# Patient Record
Sex: Female | Born: 1975 | Race: White | Hispanic: No | Marital: Married
Health system: Midwestern US, Community
[De-identification: ages and names within clinical notes are randomized; demographics above are authoritative.]

## PROBLEM LIST (undated history)

## (undated) DIAGNOSIS — F329 Major depressive disorder, single episode, unspecified: Secondary | ICD-10-CM

## (undated) DIAGNOSIS — R51 Headache: Secondary | ICD-10-CM

## (undated) DIAGNOSIS — F419 Anxiety disorder, unspecified: Secondary | ICD-10-CM

## (undated) DIAGNOSIS — R519 Headache, unspecified: Secondary | ICD-10-CM

## (undated) DIAGNOSIS — R87629 Unspecified abnormal cytological findings in specimens from vagina: Secondary | ICD-10-CM

## (undated) DIAGNOSIS — F32A Depression, unspecified: Secondary | ICD-10-CM

## (undated) DIAGNOSIS — K219 Gastro-esophageal reflux disease without esophagitis: Secondary | ICD-10-CM

## (undated) DIAGNOSIS — G4452 New daily persistent headache (NDPH): Principal | ICD-10-CM

## (undated) DIAGNOSIS — Z1231 Encounter for screening mammogram for malignant neoplasm of breast: Principal | ICD-10-CM

## (undated) DIAGNOSIS — R1084 Generalized abdominal pain: Secondary | ICD-10-CM

## (undated) DIAGNOSIS — Z1239 Encounter for other screening for malignant neoplasm of breast: Secondary | ICD-10-CM

## (undated) HISTORY — PX: WISDOM TOOTH EXTRACTION: SHX21

## (undated) HISTORY — PX: HERNIA REPAIR: SHX51

## (undated) HISTORY — DX: Depression, unspecified: F32.A

## (undated) HISTORY — DX: Major depressive disorder, single episode, unspecified: F32.9

## (undated) HISTORY — DX: Anxiety disorder, unspecified: F41.9

---

## 1998-06-06 ENCOUNTER — Emergency Department (HOSPITAL_COMMUNITY): Admission: EM | Admit: 1998-06-06 | Discharge: 1998-06-06 | Payer: Self-pay | Admitting: Emergency Medicine

## 2014-09-27 NOTE — L&D Delivery Note (Signed)
Pt presented with PROM. She was admitted to L&D and rapidly completed the first stage. She pushed for 5 min and had a SVD of one live viable infant over an intact perineum in the ROA position. Placenta-S/I. EBL-400cc. Baby to NICU.

## 2014-12-17 ENCOUNTER — Ambulatory Visit (INDEPENDENT_AMBULATORY_CARE_PROVIDER_SITE_OTHER): Payer: Worker's Compensation | Admitting: Physician Assistant

## 2014-12-17 VITALS — BP 124/74 | HR 71 | Temp 98.0°F | Resp 17 | Ht 65.0 in | Wt 146.0 lb

## 2014-12-17 DIAGNOSIS — Z209 Contact with and (suspected) exposure to unspecified communicable disease: Secondary | ICD-10-CM | POA: Diagnosis not present

## 2014-12-17 DIAGNOSIS — Y990 Civilian activity done for income or pay: Secondary | ICD-10-CM

## 2014-12-17 DIAGNOSIS — Z23 Encounter for immunization: Secondary | ICD-10-CM | POA: Diagnosis not present

## 2014-12-17 LAB — POCT CBC
GRANULOCYTE PERCENT: 51 % (ref 37–80)
HCT, POC: 38.1 % (ref 37.7–47.9)
Hemoglobin: 12.4 g/dL (ref 12.2–16.2)
Lymph, poc: 3.6 — AB (ref 0.6–3.4)
MCH, POC: 29.2 pg (ref 27–31.2)
MCHC: 32.6 g/dL (ref 31.8–35.4)
MCV: 89.6 fL (ref 80–97)
MID (cbc): 0.5 (ref 0–0.9)
MPV: 7.1 fL (ref 0–99.8)
PLATELET COUNT, POC: 285 10*3/uL (ref 142–424)
POC GRANULOCYTE: 4.3 (ref 2–6.9)
POC LYMPH %: 42.8 % (ref 10–50)
POC MID %: 6.2 %M (ref 0–12)
RBC: 4.26 M/uL (ref 4.04–5.48)
RDW, POC: 12.4 %
WBC: 8.4 10*3/uL (ref 4.6–10.2)

## 2014-12-17 LAB — POCT URINE PREGNANCY: PREG TEST UR: NEGATIVE

## 2014-12-17 MED ORDER — RALTEGRAVIR POTASSIUM 400 MG PO TABS: 400.0000 mg | ORAL_TABLET | Freq: Two times a day (BID) | ORAL | Status: AC

## 2014-12-17 MED ORDER — ONDANSETRON 8 MG PO TBDP: 8.0000 mg | ORAL_TABLET | Freq: Three times a day (TID) | ORAL | Status: DC | PRN

## 2014-12-17 MED ORDER — EMTRICITABINE-TENOFOVIR DF 200-300 MG PO TABS: 1.0000 | ORAL_TABLET | Freq: Every day | ORAL | Status: AC

## 2014-12-17 NOTE — Progress Notes (Addendum)
12/18/2014 at 10:31 AM  Wanda Moreno / DOB: 07/29/1976 / MRN: [id number]  Source Patient MRN: [id number]  SUBJECTIVE  Chief complaint: exposure to dirty dental tools   History of present illness: Wanda Moreno is 39 y.o. well appearing female presenting for a blood exposure during a dental procedure.  She was cleaning the infection sources teeth with a non hollow bore scaler, placed the instrument down and later went to reach with her right hand bumped into the scaling aspect of the instrument with her right medial hand and reports the instrument punctured her glove.  She took the glove off and saw that she was bleeding. She denies constitutional symptoms at this time.    She denies a history of infectious disease.  She was informed that the source patient has a known history of HIV, and that his viral load is undetectable as of January.  After some consideration she would she would like HIV prophylaxis.  The problem list, medications, medical, surgical, family and social reviewed and updated by me and exist elsewhere in the encounter.   Review of Systems  Psychiatric/Behavioral: Negative for depression. The patient is nervous/anxious.     OBJECTIVE  Her  height is 5\' 5"  (1.651 m) and weight is 146 lb (66.225 kg). Her oral temperature is 98 F (36.7 C). Her blood pressure is 124/74 and her pulse is 71. Her respiration is 17 and oxygen saturation is 98%.  The patient's body mass index is 24.3 kg/(m^2).  Physical Exam  Constitutional: She appears well-developed and well-nourished. No distress.  HENT:  Head: Normocephalic.  Mouth/Throat: No oropharyngeal exudate.  Eyes: Pupils are equal, round, and reactive to light. No scleral icterus.  Cardiovascular: Normal rate.   Respiratory: Effort normal.  Skin: She is not diaphoretic.    Results for orders placed or performed in visit on 12/17/14 (from the past 24 hour(s))  POCT CBC     Status: Abnormal   Collection Time: 12/17/14   7:45 PM  Result Value Ref Range   WBC 8.4 4.6 - 10.2 K/uL   Lymph, poc 3.6 (A) 0.6 - 3.4   POC LYMPH PERCENT 42.8 10 - 50 %L   MID (cbc) 0.5 0 - 0.9   POC MID % 6.2 0 - 12 %M   POC Granulocyte 4.3 2 - 6.9   Granulocyte percent 51.0 37 - 80 %G   RBC 4.26 4.04 - 5.48 M/uL   Hemoglobin 12.4 12.2 - 16.2 g/dL   HCT, POC 38.1 37.7 - 47.9 %   MCV 89.6 80 - 97 fL   MCH, POC 29.2 27 - 31.2 pg   MCHC 32.6 31.8 - 35.4 g/dL   RDW, POC 12.4 %   Platelet Count, POC 285 142 - 424 K/uL   MPV 7.1 0 - 99.8 fL  POCT urine pregnancy     Status: None   Collection Time: 12/17/14  8:20 PM  Result Value Ref Range   Preg Test, Ur Negative    Infectious disease phone consult: I personally spoke with Dr. Linus Salmons, the on call ID attending at [facility].  After explaining the details of the exposure, he felt there was a very low risk of transmission given the lack of blood and minor puncture with a non bored scaling instrument, as well as the undetectable viral load of the source patient. Author advised that patient wanted to pursue HIV prophylaxis regardless of source patient's status.  Medical history reviewed by Dr. Linus Salmons with Author, and  felt the patient would tolerate HIV prophylaxis well, and thus the benefits of therapy outweigh the risks of adverse effects.  Specialist advised triple therapy via Truvada 400 qd, and Isentress 400 mg bid for thirty days.  ASSESSMENT & PLAN  Wanda Moreno was seen today for exposure to dirty dental tools.  Diagnoses and all orders for this visit:  Exposure to communicable disease: HIV prophylaxis provided per patient's request.  Confirmed that pharmacy has medication available tonight.  Advised patient use condoms until repeat testing confirms she is HIV negative. Advised that she stay off of the WWW regarding her health. She will need to return in two weeks for a repeat BMET, CBC with diff and amylase. Advised that she take medications with food and use Zofran as prescribed.        Orders: -     Hepatitis B surface antigen -     HIV antibody -     Hepatitis C Ab Reflex HCV RNA, QUANT -     POCT CBC -     POCT urine pregnancy -     Comprehensive metabolic panel -     Amylase -     Hepatitis B surface antibody -     Tdap vaccine greater than or equal to 7yo IM -     emtricitabine-tenofovir (TRUVADA) 200-300 MG per tablet; Take 1 tablet by mouth daily. Take with food. -     raltegravir (ISENTRESS) 400 MG tablet; Take 1 tablet (400 mg total) by mouth 2 (two) times daily. -     ondansetron (ZOFRAN-ODT) 8 MG disintegrating tablet; Take 1 tablet (8 mg total) by mouth every 8 (eight) hours as needed for nausea.   I personally spent more than 60 minutes with this patient, with the majority of that time spent in counseling and discussion of the assessment and plan.    The patient was advised to call or come back to clinic if she does not see an improvement in symptoms, or worsens with the above plan.   [name], MHS, PA-C [facility] 12/18/2014 10:31 AM

## 2014-12-18 ENCOUNTER — Telehealth: Payer: Self-pay

## 2014-12-18 ENCOUNTER — Encounter: Payer: Self-pay | Admitting: Physician Assistant

## 2014-12-18 DIAGNOSIS — F411 Generalized anxiety disorder: Secondary | ICD-10-CM

## 2014-12-18 DIAGNOSIS — F5105 Insomnia due to other mental disorder: Principal | ICD-10-CM

## 2014-12-18 DIAGNOSIS — F409 Phobic anxiety disorder, unspecified: Secondary | ICD-10-CM

## 2014-12-18 MED ORDER — TEMAZEPAM 7.5 MG PO CAPS: 7.5000 mg | ORAL_CAPSULE | Freq: Every evening | ORAL | Status: DC | PRN

## 2014-12-18 NOTE — Telephone Encounter (Signed)
Michael please advise.

## 2014-12-18 NOTE — Telephone Encounter (Signed)
Called in Rx for pt, Temazepam. Pt notified.

## 2014-12-18 NOTE — Telephone Encounter (Signed)
Legrand Como told pt to come get rx, but he is not here to sign. LMOM to CB to find out what pharm she wants this called into (unsure if she wants to use same pharm as last night) Tried calling again-did not LM the second time.

## 2014-12-18 NOTE — Telephone Encounter (Signed)
Patient was seen yesterday for an exposure. Due to the outcome of this situation patient states she was not able to sleep last night. Patient is requesting something be called in for her to rest. She is also concerned because there is an "A" beside her lymph results on her lab.

## 2014-12-18 NOTE — Telephone Encounter (Signed)
Spoke with patient. She report her husband Young Brim at [phone] i shaving a difficult time with the news of her exposure.  She also reports difficulty sleeping due to excessive worry, and request that something be prescribed that will allow her to rest.  [name], MS, PA-C   11:54 AM, 12/18/2014

## 2014-12-19 ENCOUNTER — Encounter: Payer: Self-pay | Admitting: Physician Assistant

## 2014-12-19 ENCOUNTER — Telehealth: Payer: Self-pay | Admitting: Physician Assistant

## 2014-12-19 NOTE — Telephone Encounter (Signed)
I called in Rx for pt. Pt notified.

## 2014-12-20 ENCOUNTER — Encounter: Payer: Self-pay | Admitting: Physician Assistant

## 2014-12-20 ENCOUNTER — Telehealth: Payer: Self-pay

## 2014-12-20 NOTE — Telephone Encounter (Signed)
Requesting a call from [name]

## 2014-12-20 NOTE — Telephone Encounter (Signed)
Open in error

## 2014-12-20 NOTE — Telephone Encounter (Signed)
Wanda Moreno please call.

## 2014-12-21 ENCOUNTER — Ambulatory Visit (INDEPENDENT_AMBULATORY_CARE_PROVIDER_SITE_OTHER): Payer: BLUE CROSS/BLUE SHIELD | Admitting: Urgent Care

## 2014-12-21 VITALS — BP 106/68 | HR 82 | Temp 98.0°F | Resp 16 | Ht 64.0 in | Wt 147.0 lb

## 2014-12-21 DIAGNOSIS — Z87440 Personal history of urinary (tract) infections: Secondary | ICD-10-CM

## 2014-12-21 DIAGNOSIS — N390 Urinary tract infection, site not specified: Secondary | ICD-10-CM | POA: Diagnosis not present

## 2014-12-21 DIAGNOSIS — R319 Hematuria, unspecified: Secondary | ICD-10-CM | POA: Diagnosis not present

## 2014-12-21 DIAGNOSIS — Z87442 Personal history of urinary calculi: Secondary | ICD-10-CM

## 2014-12-21 DIAGNOSIS — R300 Dysuria: Secondary | ICD-10-CM | POA: Diagnosis not present

## 2014-12-21 DIAGNOSIS — Z87448 Personal history of other diseases of urinary system: Secondary | ICD-10-CM

## 2014-12-21 LAB — POCT URINALYSIS DIPSTICK
Bilirubin, UA: NEGATIVE
Glucose, UA: 100
KETONES UA: NEGATIVE
NITRITE UA: POSITIVE
PH UA: 5
Protein, UA: NEGATIVE
Spec Grav, UA: <=1.005
Urobilinogen, UA: 0.2

## 2014-12-21 LAB — POCT UA - MICROSCOPIC ONLY
Bacteria, U Microscopic: NEGATIVE
Casts, Ur, LPF, POC: NEGATIVE
Crystals, Ur, HPF, POC: NEGATIVE
Epithelial cells, urine per micros: NEGATIVE
MUCUS UA: NEGATIVE
RBC, URINE, MICROSCOPIC: NEGATIVE
Yeast, UA: NEGATIVE

## 2014-12-21 MED ORDER — CIPROFLOXACIN HCL 500 MG PO TABS: 500.0000 mg | ORAL_TABLET | Freq: Two times a day (BID) | ORAL | Status: DC

## 2014-12-21 NOTE — Progress Notes (Signed)
    MRN: [id number] DOB: [birth year]  Subjective:   Wanda Moreno is a 39 y.o. female presenting for chief complaint of Urinary Tract Infection  Reports onset of dysuria this am, sensation of incomplete emptying, associated with malodorous urine, pelvic pressure. Has tried Azo otc with minimal relief. Has had UTI's frequently since her 20's, hx of renal stones, pyelonephritis. Denies fevers, hematuria, cloudy urine, urinary frequency, abdominal pain, vomiting, vaginal irritation, genital rashes, vaginal bleeding. Denies smoking, occasional alcohol drink. Denies any other aggravating or relieving factors, no other questions or concerns.  Wanda Moreno has a current medication list which includes the following prescription(s): emtricitabine-tenofovir, ondansetron, raltegravir, and temazepam. She has No Known Allergies.  Wanda Moreno  has no past medical history on file. Also  has no past surgical history on file.  ROS As in subjective.  Objective:   Vitals: BP 106/68 mmHg  Pulse 82  Temp(Src) 98 F (36.7 C)  Resp 16  Ht 5\' 4"  (1.626 m)  Wt 147 lb (66.679 kg)  BMI 25.22 kg/m2  SpO2 98%  LMP 12/11/2014  Physical Exam  Constitutional: She is oriented to person, place, and time and well-developed, well-nourished, and in no distress.  Cardiovascular: Normal rate, regular rhythm and intact distal pulses.  Exam reveals no gallop and no friction rub.   No murmur heard. Pulmonary/Chest: No respiratory distress. She has no wheezes. She has no rales. She exhibits no tenderness.  Abdominal: Soft. Bowel sounds are normal. She exhibits no distension and no mass. There is tenderness (suprapubic discomfort with deep palpation).  No CVA tenderness.  Neurological: She is alert and oriented to person, place, and time.  Skin: Skin is warm and dry. No rash noted. No erythema. No pallor.   Results for orders placed or performed in visit on 12/21/14 (from the past 24 hour(s))  POCT UA - Microscopic Only      Status: None   Collection Time: 12/21/14 12:18 PM  Result Value Ref Range   WBC, Ur, HPF, POC 10-15    RBC, urine, microscopic neg    Bacteria, U Microscopic neg    Mucus, UA neg    Epithelial cells, urine per micros neg    Crystals, Ur, HPF, POC neg    Casts, Ur, LPF, POC neg    Yeast, UA neg   POCT urinalysis dipstick     Status: None   Collection Time: 12/21/14 12:18 PM  Result Value Ref Range   Color, UA orange    Clarity, UA clear    Glucose, UA 100    Bilirubin, UA neg    Ketones, UA neg    Spec Grav, UA <=1.005    Blood, UA small    pH, UA 5.0    Protein, UA neg    Urobilinogen, UA 0.2    Nitrite, UA positive    Leukocytes, UA small (1+)    Assessment and Plan :   1. Dysuria 2. Urinary tract infection with hematuria, site unspecified 3. History of pyelonephritis 4. History of renal stone - Start ciprofloxacin x10 days, urine culture pending. Advised aggressive hydration, continue Azo. Referral to urology for evaluation of frequent UTI's, history of pyelonephritis and renal stones. - Return to clinic as needed.  Jaynee Eagles, PA-C [facility] [phone] 12/21/2014 12:18 PM

## 2014-12-22 LAB — URINE CULTURE
Colony Count: NO GROWTH
ORGANISM ID, BACTERIA: NO GROWTH

## 2014-12-23 ENCOUNTER — Encounter: Payer: Self-pay | Admitting: Physician Assistant

## 2014-12-23 NOTE — Telephone Encounter (Signed)
Michael please call.

## 2014-12-23 NOTE — Telephone Encounter (Signed)
Legrand Como please call - patient has more questions - just got off the phone with you within five minutes   [phone]

## 2014-12-23 NOTE — Telephone Encounter (Signed)
Patient has a swollen lymp node and is very concerned.   Work related exposure,  She is four hours away.  Please call asap.   Workers [employer].  Sounds panicky.  Suggested she come in.  Wants a doctor to advise her, if she can wait to come in this evening or go to someone where she is now.   Prioritized   [phone]

## 2014-12-24 ENCOUNTER — Ambulatory Visit (INDEPENDENT_AMBULATORY_CARE_PROVIDER_SITE_OTHER): Payer: Worker's Compensation | Admitting: Physician Assistant

## 2014-12-24 VITALS — BP 112/70 | HR 74 | Temp 98.2°F | Resp 18 | Ht 64.5 in | Wt 148.5 lb

## 2014-12-24 DIAGNOSIS — F410 Panic disorder [episodic paroxysmal anxiety] without agoraphobia: Secondary | ICD-10-CM | POA: Diagnosis not present

## 2014-12-24 DIAGNOSIS — R591 Generalized enlarged lymph nodes: Secondary | ICD-10-CM

## 2014-12-24 DIAGNOSIS — F430 Acute stress reaction: Principal | ICD-10-CM

## 2014-12-24 MED ORDER — CLONAZEPAM 0.5 MG PO TABS: 0.5000 mg | ORAL_TABLET | Freq: Two times a day (BID) | ORAL | Status: DC | PRN

## 2014-12-24 NOTE — Progress Notes (Signed)
12/25/2014 at 8:40 AM  [place] / DOB: 1975-10-15 / MRN: [id number]  The patient  does not have a problem list on file.  SUBJECTIVE  Chief complaint: Follow-up   History of present illness: Wanda Moreno is 39 y.o. well appearing female presenting for lymphadenopathy.  She is concerned given an accident that occurred at work seven days ago in which she was possibly exposed to HIV while doing a dental cleaning.  She says the lymphadenopathy started in her neck two days ago, and it has been very worrisome for her. She received a tetanus shot seven days ago, and has been taking her HIV prophylaxis medications and has not missed any doses. She has also been taking temazepam to sleep and Zofran for nausea related to her medications.  She complains that the stress of the exposure has been very difficult for her emotionally, and she has been feeling very anxious at work and at home.  She says that anxious thoughts occur repeatedly and she can do little to control them, and she will often start shaking and have difficulty communicating with her coworkers.  She also reports that she and her husband experienced a condom failure on 3/24.  She says that the husband is not worried, nor is he worried about her contracting the illness given the low risk nature of the exposure and the treatment plan.  Despite all this, she continues to worry about him and herself.   She  has no past medical history on file.    She has a current medication list which includes the following prescription(s): ciprofloxacin, emtricitabine-tenofovir, ondansetron, raltegravir, temazepam, and clonazepam.  Wanda Moreno has No Known Allergies. She  reports that she has quit smoking. She has never used smokeless tobacco. She reports that she drinks alcohol. She reports that she does not use illicit drugs. She  has no sexual activity history on file. The patient  has no past surgical history on file.  Her family history includes  Hypertension in her mother.  Review of Systems  Constitutional: Negative.   HENT: Negative for sore throat.   Respiratory: Negative for cough.   Cardiovascular: Negative for chest pain.  Gastrointestinal: Negative.   Genitourinary: Negative.   Musculoskeletal: Negative for neck pain.  Skin: Negative for rash.  Neurological: Negative for dizziness, sensory change and headaches.  Psychiatric/Behavioral: The patient is nervous/anxious.     OBJECTIVE  Her  height is 5' 4.5" (1.638 m) and weight is 148 lb 8 oz (67.359 kg). Her oral temperature is 98.2 F (36.8 C). Her blood pressure is 112/70 and her pulse is 74. Her respiration is 18 and oxygen saturation is 99%.  The patient's body mass index is 25.11 kg/(m^2).  Physical Exam  Vitals reviewed. Constitutional: She is oriented to person, place, and time. Vital signs are normal. She does not have a sickly appearance.  Cardiovascular: Normal rate, regular rhythm and normal heart sounds.   Respiratory: Effort normal and breath sounds normal.  GI: Soft. Bowel sounds are normal.  Lymphadenopathy:       Head (right side): No submental, no submandibular, no tonsillar, no preauricular, no posterior auricular and no occipital adenopathy present.       Head (left side): No submental, no submandibular, no tonsillar, no preauricular, no posterior auricular and no occipital adenopathy present.    She has cervical adenopathy.       Right cervical: No superficial cervical, no deep cervical and no posterior cervical adenopathy present.  Left cervical: Superficial cervical and posterior cervical adenopathy present. No deep cervical adenopathy present.  Two cervical nodes present with minimal swelling.  There is no tenderness with palpation.    Neurological: She is alert and oriented to person, place, and time. No cranial nerve deficit.  Skin: Skin is warm and dry.    No results found for this or any previous visit (from the past 24  hour(s)).  ASSESSMENT & PLAN  Wanda Moreno was seen today for follow-up.  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 -     Ambulatory referral to Psychology  Lymphadenopathy: Listed in epocrates as a common reaction to the TDAP.    She is to return to work at her next scheduled shift and she is to follow up in 7 days for a CMET, CBC with Diff, and a Urine Pregnancy.    The patient was advised to call or come back to clinic if she does not see an improvement in symptoms, or worsens with the above plan.   [name], MHS, PA-C [facility] 12/25/2014 8:40 AM

## 2014-12-31 ENCOUNTER — Ambulatory Visit: Payer: Worker's Compensation

## 2014-12-31 ENCOUNTER — Telehealth: Payer: Self-pay

## 2014-12-31 DIAGNOSIS — R591 Generalized enlarged lymph nodes: Secondary | ICD-10-CM

## 2014-12-31 DIAGNOSIS — Z209 Contact with and (suspected) exposure to unspecified communicable disease: Secondary | ICD-10-CM

## 2014-12-31 LAB — POCT CBC
GRANULOCYTE PERCENT: 47.5 % (ref 37–80)
HCT, POC: 41.6 % (ref 37.7–47.9)
Hemoglobin: 13.4 g/dL (ref 12.2–16.2)
Lymph, poc: 4.1 — AB (ref 0.6–3.4)
MCH, POC: 28.5 pg (ref 27–31.2)
MCHC: 32.2 g/dL (ref 31.8–35.4)
MCV: 88.7 fL (ref 80–97)
MID (CBC): 0.6 (ref 0–0.9)
MPV: 7.4 fL (ref 0–99.8)
PLATELET COUNT, POC: 272 10*3/uL (ref 142–424)
POC Granulocyte: 4.2 (ref 2–6.9)
POC LYMPH %: 45.8 % (ref 10–50)
POC MID %: 6.7 % (ref 0–12)
RBC: 4.69 M/uL (ref 4.04–5.48)
RDW, POC: 13.2 %
WBC: 8.9 10*3/uL (ref 4.6–10.2)

## 2014-12-31 LAB — POCT URINE PREGNANCY: PREG TEST UR: NEGATIVE

## 2014-12-31 NOTE — Telephone Encounter (Signed)
Patient states she was told to RTC by [name] PA  to have lab work to check her kidney and liver function. Patient was wanting to come in today at her lunch but I don't see any orders. Patient is wanting to come today and not wait to see a doctor. I informed patient I would put a message in for her and have someone call her back at [phone]

## 2014-12-31 NOTE — Telephone Encounter (Signed)
Michael--please advise. Can you order correct labs for pt?

## 2015-01-01 ENCOUNTER — Other Ambulatory Visit: Payer: Self-pay | Admitting: Physician Assistant

## 2015-01-01 DIAGNOSIS — Z79899 Other long term (current) drug therapy: Secondary | ICD-10-CM

## 2015-01-01 NOTE — Progress Notes (Signed)
Ordering labs given HIV prophylaxis administration. [name], MS, PA-C   8:29 PM, 01/01/2015

## 2015-01-02 ENCOUNTER — Telehealth: Payer: Self-pay

## 2015-01-02 NOTE — Telephone Encounter (Signed)
Wanda Moreno - Pt was left a message that she needed to come back for a blood draw.  She did this two days ago.  She wanted to know if we knew any results yet.  She is concerned because she thought we knew she came in two days ago for this and is scared we possibly lost her blood.  Please call [phone]

## 2015-01-02 NOTE — Progress Notes (Signed)
Called pt and she was unaware that she needs to come back in now. Wanda Moreno will ask Legrand Como for details as to reason labs are needed and when pt should come in for Labs only.

## 2015-01-02 NOTE — Telephone Encounter (Signed)
Im confused. Legrand Como?

## 2015-01-08 ENCOUNTER — Encounter: Payer: Self-pay | Admitting: Physician Assistant

## 2015-01-17 ENCOUNTER — Ambulatory Visit (INDEPENDENT_AMBULATORY_CARE_PROVIDER_SITE_OTHER): Payer: BLUE CROSS/BLUE SHIELD | Admitting: Physician Assistant

## 2015-01-17 VITALS — BP 102/60 | HR 100 | Temp 98.1°F | Resp 16 | Ht 65.0 in | Wt 147.4 lb

## 2015-01-17 DIAGNOSIS — J069 Acute upper respiratory infection, unspecified: Secondary | ICD-10-CM | POA: Diagnosis not present

## 2015-01-17 DIAGNOSIS — R000 Tachycardia, unspecified: Secondary | ICD-10-CM

## 2015-01-17 DIAGNOSIS — B9789 Other viral agents as the cause of diseases classified elsewhere: Secondary | ICD-10-CM

## 2015-01-17 DIAGNOSIS — R6889 Other general symptoms and signs: Secondary | ICD-10-CM | POA: Diagnosis not present

## 2015-01-17 LAB — POCT URINALYSIS DIPSTICK
BILIRUBIN UA: NEGATIVE
Blood, UA: NEGATIVE
Glucose, UA: NEGATIVE
Ketones, UA: NEGATIVE
Leukocytes, UA: NEGATIVE
Nitrite, UA: NEGATIVE
PH UA: 7
PROTEIN UA: NEGATIVE
SPEC GRAV UA: 1.015
Urobilinogen, UA: 0.2

## 2015-01-17 LAB — POCT UA - MICROSCOPIC ONLY
Bacteria, U Microscopic: NEGATIVE
Casts, Ur, LPF, POC: NEGATIVE
Crystals, Ur, HPF, POC: NEGATIVE
Mucus, UA: NEGATIVE
RBC, urine, microscopic: NEGATIVE
WBC, Ur, HPF, POC: NEGATIVE
Yeast, UA: NEGATIVE

## 2015-01-17 LAB — POCT INFLUENZA A/B
INFLUENZA B, POC: NEGATIVE
Influenza A, POC: NEGATIVE

## 2015-01-17 MED ORDER — CETIRIZINE-PSEUDOEPHEDRINE ER 5-120 MG PO TB12: 1.0000 | ORAL_TABLET | ORAL | Status: AC

## 2015-01-17 MED ORDER — HYDROCODONE-HOMATROPINE 5-1.5 MG/5ML PO SYRP: 5.0000 mL | ORAL_SOLUTION | Freq: Three times a day (TID) | ORAL | Status: DC | PRN

## 2015-01-17 MED ORDER — IBUPROFEN 600 MG PO TABS: 600.0000 mg | ORAL_TABLET | Freq: Three times a day (TID) | ORAL | Status: DC | PRN

## 2015-01-17 NOTE — Progress Notes (Signed)
01/18/2015 at 7:54 AM  Wanda Moreno / DOB: May 15, 1976 / MRN: [id number]  The patient  does not have a problem list on file.  SUBJECTIVE  Chief complaint: Generalized Body Aches  Patient here for flu like symptoms that started 24 hours ago.  Reports extreme fatigue today.  She reports HA, neck pain, fatigue, coughing, along with generalized body aches. She did not receive this years flu shot.     She has just stopped taking Truvada and Issentress as HIV prohpylaxsis for an exposure that happened roughly thirty days previous.   She  has no past medical history on file.    Medications reviewed and updated by myself where necessary, and exist elsewhere in the encounter.   Wanda Moreno has No Known Allergies. She  reports that she has quit smoking. She has never used smokeless tobacco. She reports that she drinks alcohol. She reports that she does not use illicit drugs. She  has no sexual activity history on file. The patient  has no past surgical history on file.  Her family history includes Hypertension in her mother.  Review of Systems  Constitutional: Positive for chills. Negative for fever, weight loss, malaise/fatigue and diaphoresis.  HENT: Negative for sore throat.   Respiratory: Positive for cough.   Cardiovascular: Negative for chest pain.  Genitourinary: Negative for dysuria, urgency, frequency, hematuria and flank pain.  Musculoskeletal: Positive for myalgias and neck pain. Negative for back pain and joint pain.  Skin: Negative for itching and rash.  Neurological: Negative for dizziness, weakness and headaches.    OBJECTIVE  Her  height is 5\' 5"  (1.651 m) and weight is 147 lb 6 oz (66.849 kg). Her oral temperature is 98.1 F (36.7 C). Her blood pressure is 102/60 and her pulse is 100. Her respiration is 16 and oxygen saturation is 99%.  The patient's body mass index is 24.52 kg/(m^2).  Physical Exam  Constitutional: She is oriented to person, place, and time. She  appears well-developed and well-nourished. No distress.  Cardiovascular: Regular rhythm and normal heart sounds.   No extrasystoles are present. Tachycardia present.   Respiratory: Effort normal and breath sounds normal.  GI: Soft.  Musculoskeletal: Normal range of motion.  Neurological: She is alert and oriented to person, place, and time. No cranial nerve deficit.  Skin: Skin is warm and dry. She is not diaphoretic.  Psychiatric: She has a normal mood and affect. Her behavior is normal. Judgment and thought content normal.    Results for orders placed or performed in visit on 01/17/15 (from the past 24 hour(s))  POCT urinalysis dipstick     Status: None   Collection Time: 01/17/15  6:30 PM  Result Value Ref Range   Color, UA yellow    Clarity, UA clear    Glucose, UA neg    Bilirubin, UA neg    Ketones, UA neg    Spec Grav, UA 1.015    Blood, UA neg    pH, UA 7.0    Protein, UA neg    Urobilinogen, UA 0.2    Nitrite, UA neg    Leukocytes, UA Negative   POCT UA - Microscopic Only     Status: None   Collection Time: 01/17/15  6:30 PM  Result Value Ref Range   WBC, Ur, HPF, POC neg    RBC, urine, microscopic neg    Bacteria, U Microscopic neg    Mucus, UA neg    Epithelial cells, urine per micros 0-1  Crystals, Ur, HPF, POC neg    Casts, Ur, LPF, POC neg    Yeast, UA neg   POCT Influenza A/B     Status: None   Collection Time: 01/17/15  6:30 PM  Result Value Ref Range   Influenza A, POC Negative    Influenza B, POC Negative     ASSESSMENT & PLAN  Wanda Moreno was seen today for generalized body aches.  Diagnoses and all orders for this visit:  Tachycardia Orders: -     POCT urinalysis dipstick -     POCT UA - Microscopic Only  Flu-like symptoms Orders: -     POCT Influenza A/B  Viral URI with cough: Patient is well hydrated.  Flu is possible, however she denies high fever, severe cough and headache.   Orders: -     HYDROcodone-homatropine (HYCODAN) 5-1.5  MG/5ML syrup; Take 5 mLs by mouth every 8 (eight) hours as needed for cough. -     ibuprofen (ADVIL,MOTRIN) 600 MG tablet; Take 1 tablet (600 mg total) by mouth every 8 (eight) hours as needed. -     cetirizine-pseudoephedrine (ZYRTEC-D ALLERGY & CONGESTION) 5-120 MG per tablet; Take 1 tablet by mouth every morning.    The patient was advised to call or come back to clinic if she does not see an improvement in symptoms, or worsens with the above plan.   [name], MHS, PA-C [facility] 01/18/2015 7:54 AM

## 2015-01-24 ENCOUNTER — Telehealth: Payer: Self-pay | Admitting: Radiology

## 2015-01-24 ENCOUNTER — Other Ambulatory Visit: Payer: Self-pay | Admitting: Physician Assistant

## 2015-01-24 DIAGNOSIS — Z579 Occupational exposure to unspecified risk factor: Secondary | ICD-10-CM

## 2015-01-24 NOTE — Telephone Encounter (Signed)
Pt notified to come in in 6 weeks for follow up blood work. Pt understood.

## 2015-01-28 ENCOUNTER — Encounter: Payer: Self-pay | Admitting: Physician Assistant

## 2015-01-28 ENCOUNTER — Ambulatory Visit (INDEPENDENT_AMBULATORY_CARE_PROVIDER_SITE_OTHER): Payer: Managed Care, Other (non HMO) | Admitting: Family Medicine

## 2015-01-28 ENCOUNTER — Ambulatory Visit (INDEPENDENT_AMBULATORY_CARE_PROVIDER_SITE_OTHER): Payer: Worker's Compensation | Admitting: Physician Assistant

## 2015-01-28 VITALS — BP 102/64 | HR 70 | Temp 98.0°F | Resp 20 | Ht 64.0 in | Wt 147.5 lb

## 2015-01-28 DIAGNOSIS — Z79899 Other long term (current) drug therapy: Secondary | ICD-10-CM | POA: Diagnosis not present

## 2015-01-28 DIAGNOSIS — Z209 Contact with and (suspected) exposure to unspecified communicable disease: Secondary | ICD-10-CM | POA: Diagnosis not present

## 2015-01-28 DIAGNOSIS — R21 Rash and other nonspecific skin eruption: Secondary | ICD-10-CM | POA: Diagnosis not present

## 2015-01-28 DIAGNOSIS — Z579 Occupational exposure to unspecified risk factor: Secondary | ICD-10-CM

## 2015-01-28 LAB — POCT SKIN KOH: SKIN KOH, POC: NEGATIVE

## 2015-01-28 MED ORDER — CLOTRIMAZOLE-BETAMETHASONE 1-0.05 % EX CREA: 1.0000 "application " | TOPICAL_CREAM | Freq: Two times a day (BID) | CUTANEOUS | Status: DC

## 2015-01-28 NOTE — Progress Notes (Signed)
   01/28/2015 at 1:33 PM  [place] / DOB: Dec 08, 1975 / MRN: [id number]  The patient  does not have a problem list on file.  SUBJECTIVE  Chief complaint: Rash   Wanda Moreno is a pleasant 39 y.o. female here today for eval of a pruritic rash present on the left upper thigh and gluteus.  It has been present for roughly 6 weeks. She reports sharing a towel with her sister who later learned she had ringworm.  Pt has tried OTC antifungal sprays with transient relief of her symptoms. She denies vaginal symptoms today.   She  has no past medical history on file.    Medications reviewed and updated by myself where necessary, and exist elsewhere in the encounter.   Wanda Moreno has No Known Allergies. She  reports that she has quit smoking. She has never used smokeless tobacco. She reports that she drinks alcohol. She reports that she does not use illicit drugs. She  has no sexual activity history on file. The patient  has no past surgical history on file.  Her family history includes Hypertension in her mother.  Review of Systems  Constitutional: Negative for fever and chills.  Respiratory: Negative for cough.   Cardiovascular: Negative for chest pain.  Gastrointestinal: Negative for heartburn.  Genitourinary: Negative for dysuria and flank pain.  Skin: Positive for itching and rash.  Neurological: Negative for dizziness and headaches.  Endo/Heme/Allergies: Negative for polydipsia.  Psychiatric/Behavioral: Negative for depression.    OBJECTIVE  Her  height is 5\' 4"  (1.626 m) and weight is 147 lb 8 oz (66.906 kg). Her oral temperature is 98 F (36.7 C). Her blood pressure is 102/64 and her pulse is 70. Her respiration is 20 and oxygen saturation is 97%.  The patient's body mass index is 25.31 kg/(m^2).  Physical Exam  Constitutional: She is oriented to person, place, and time. She appears well-developed and well-nourished.  Cardiovascular: Normal rate.   Respiratory: Effort  normal.  Musculoskeletal: Normal range of motion.  Neurological: She is alert and oriented to person, place, and time. No cranial nerve deficit.  Skin: Skin is warm and dry. Rash noted.     Psychiatric: She has a normal mood and affect. Her behavior is normal. Judgment and thought content normal.    Results for orders placed or performed in visit on 01/28/15 (from the past 24 hour(s))  POCT Skin KOH     Status: None   Collection Time: 01/28/15  1:30 PM  Result Value Ref Range   Skin KOH, POC Negative     ASSESSMENT & PLAN  Wanda Moreno was seen today for rash.  Diagnoses and all orders for this visit:  Rash and nonspecific skin eruption: Unconfirmed etiology. Will treat symptomatically. Patient to call or RTC should she fail to improve with therapy.  Orders: -     POCT Skin KOH -     clotrimazole-betamethasone (LOTRISONE) cream; Apply 1 application topically 2 (two) times daily.    The patient was advised to call or come back to clinic if she does not see an improvement in symptoms, or worsens with the above plan.   [name], MHS, PA-C [facility] 01/28/2015 1:33 PM

## 2015-01-28 NOTE — Progress Notes (Signed)
   01/28/2015 at 1:33 PM  Wanda Moreno / DOB: 1976/08/21 / MRN: [id number]  The patient  does not have a problem list on file.  SUBJECTIVE  Chief complaint: Follow-up  Patient here for a 6 weeks follow HIV test 2/2 exposure that occurred to a known HIV positive source.  Patient has taken 1 month of Truvada and Issentress per ID recommendation. She reports she feels well today, aside from some anxiety regarding the results of her HIV testing.   She  has no past medical history on file.    Medications reviewed and updated by myself where necessary, and exist elsewhere in the encounter.   Wanda Moreno has No Known Allergies. She  reports that she has quit smoking. She has never used smokeless tobacco. She reports that she drinks alcohol. She reports that she does not use illicit drugs. She  has no sexual activity history on file. The patient  has no past surgical history on file.  Her family history includes Hypertension in her mother.  Review of Systems  Constitutional: Negative.   HENT: Negative.   Eyes: Negative.   Respiratory: Negative.   Cardiovascular: Negative.   Gastrointestinal: Negative.   Genitourinary: Negative.   Musculoskeletal: Negative.   Skin: Negative.   Neurological: Negative.   Endo/Heme/Allergies: Negative.   Psychiatric/Behavioral: Negative.     OBJECTIVE  Her  height is 5\' 4"  (1.626 m) and weight is 147 lb 8 oz (66.906 kg). Her oral temperature is 98 F (36.7 C). Her blood pressure is 102/64 and her pulse is 70. Her respiration is 20 and oxygen saturation is 97%.  The patient's body mass index is 25.31 kg/(m^2).  Physical Exam  Constitutional: She is oriented to person, place, and time. She appears well-developed.  Neck: Normal range of motion.  Cardiovascular: Normal rate.   Respiratory: Effort normal.  GI: She exhibits no distension.  Musculoskeletal: Normal range of motion.  Neurological: She is alert and oriented to person, place, and time.    Skin: Skin is warm and dry.  Psychiatric: She has a normal mood and affect. Her behavior is normal. Judgment and thought content normal.    No results found for this or any previous visit (from the past 24 hour(s)).  ASSESSMENT & PLAN  Wanda Moreno was seen today for follow-up.  Diagnoses and all orders for this visit:  Occupational exposure in workplace: 6 weeks labs drawn.  Will deliver the results to the patient ASAP.  Orders: -     HIV antibody -     CBC with Differential/Platelet -     Comprehensive metabolic panel  High risk medication use Orders: -     CBC with Differential/Platelet -     Comprehensive metabolic panel    The patient was advised to call or come back to clinic if she does not see an improvement in symptoms, or worsens with the above plan.   [name], MHS, PA-C [facility] 01/28/2015 1:33 PM

## 2015-01-29 LAB — CBC WITH DIFFERENTIAL/PLATELET
Basophils Absolute: 0 10*3/uL (ref 0.0–0.1)
Basophils Relative: 0 % (ref 0–1)
Eosinophils Absolute: 0.3 10*3/uL (ref 0.0–0.7)
Eosinophils Relative: 4 % (ref 0–5)
HCT: 35.8 % — ABNORMAL LOW (ref 36.0–46.0)
Hemoglobin: 11.9 g/dL — ABNORMAL LOW (ref 12.0–15.0)
Lymphocytes Relative: 41 % (ref 12–46)
Lymphs Abs: 3.2 10*3/uL (ref 0.7–4.0)
MCH: 29 pg (ref 26.0–34.0)
MCHC: 33.2 g/dL (ref 30.0–36.0)
MCV: 87.3 fL (ref 78.0–100.0)
MPV: 10.1 fL (ref 8.6–12.4)
Monocytes Absolute: 0.3 10*3/uL (ref 0.1–1.0)
Monocytes Relative: 4 % (ref 3–12)
Neutro Abs: 3.9 10*3/uL (ref 1.7–7.7)
Neutrophils Relative %: 51 % (ref 43–77)
Platelets: 250 10*3/uL (ref 150–400)
RBC: 4.1 MIL/uL (ref 3.87–5.11)
RDW: 13.5 % (ref 11.5–15.5)
WBC: 7.7 10*3/uL (ref 4.0–10.5)

## 2015-01-29 LAB — COMPREHENSIVE METABOLIC PANEL
ALT: 13 U/L (ref 0–35)
AST: 16 U/L (ref 0–37)
Albumin: 4.1 g/dL (ref 3.5–5.2)
Alkaline Phosphatase: 49 U/L (ref 39–117)
BUN: 13 mg/dL (ref 6–23)
CO2: 25 mEq/L (ref 19–32)
Calcium: 9.1 mg/dL (ref 8.4–10.5)
Chloride: 105 mEq/L (ref 96–112)
Creat: 0.76 mg/dL (ref 0.50–1.10)
Glucose, Bld: 102 mg/dL — ABNORMAL HIGH (ref 70–99)
Potassium: 3.5 mEq/L (ref 3.5–5.3)
Sodium: 138 mEq/L (ref 135–145)
Total Bilirubin: 0.5 mg/dL (ref 0.2–1.2)
Total Protein: 6.6 g/dL (ref 6.0–8.3)

## 2015-01-29 LAB — HIV ANTIBODY (ROUTINE TESTING W REFLEX): HIV 1&2 Ab, 4th Generation: NONREACTIVE

## 2015-01-30 NOTE — Progress Notes (Signed)
Pt assessed, reviewed documentation and agree w/ assessment and plan. Delman Cheadle, MD MPH Patient ID: Wanda Moreno, female   DOB: [birth year], 39 y.o.   MRN: [id number]

## 2015-03-05 ENCOUNTER — Other Ambulatory Visit: Payer: Self-pay | Admitting: Obstetrics and Gynecology

## 2015-03-05 DIAGNOSIS — N63 Unspecified lump in unspecified breast: Secondary | ICD-10-CM

## 2015-03-14 LAB — OB RESULTS CONSOLE GC/CHLAMYDIA
Chlamydia: NEGATIVE
Gonorrhea: NEGATIVE

## 2015-03-28 ENCOUNTER — Other Ambulatory Visit: Payer: Self-pay

## 2015-04-02 LAB — OB RESULTS CONSOLE RPR: RPR: NONREACTIVE

## 2015-04-02 LAB — OB RESULTS CONSOLE ABO/RH: RH TYPE: POSITIVE

## 2015-04-02 LAB — OB RESULTS CONSOLE RUBELLA ANTIBODY, IGM: RUBELLA: IMMUNE

## 2015-04-02 LAB — OB RESULTS CONSOLE HEPATITIS B SURFACE ANTIGEN: HEP B S AG: NEGATIVE

## 2015-04-02 LAB — OB RESULTS CONSOLE HIV ANTIBODY (ROUTINE TESTING): HIV: NONREACTIVE

## 2015-04-02 LAB — OB RESULTS CONSOLE ANTIBODY SCREEN: Antibody Screen: NEGATIVE

## 2015-04-04 ENCOUNTER — Ambulatory Visit
Admission: RE | Admit: 2015-04-04 | Discharge: 2015-04-04 | Disposition: A | Discharge status: Home / Self Care | Payer: Commercial Indemnity | Source: Ambulatory Visit | Attending: Obstetrics and Gynecology | Admitting: Obstetrics and Gynecology

## 2015-04-04 DIAGNOSIS — N63 Unspecified lump in unspecified breast: Secondary | ICD-10-CM

## 2015-08-06 ENCOUNTER — Ambulatory Visit (INDEPENDENT_AMBULATORY_CARE_PROVIDER_SITE_OTHER): Payer: Managed Care, Other (non HMO) | Admitting: Emergency Medicine

## 2015-08-06 VITALS — BP 108/62 | HR 92 | Temp 98.2°F | Resp 18 | Ht 64.0 in | Wt 175.0 lb

## 2015-08-06 DIAGNOSIS — L723 Sebaceous cyst: Secondary | ICD-10-CM | POA: Diagnosis not present

## 2015-08-06 NOTE — Progress Notes (Signed)
Subjective:  Patient ID: Alain Honey, female    DOB: 1976-04-06  Age: 39 y.o. MRN: [id number]  CC: Insect Bite   HPI America Sandall presents   With a one- month history of small lesion on her right mid abdomen. She said she was treated initially at 2 weeks ago by her gynecologist who drained it in curetted the lesion. She said the lesions recurred and has bothering her by being sensitive to touch. She has no cellulitis or drainage. No fever or chills. She is no history of injury. She was initially  Thinking thatshe been  "bitten by spider"  History [place] has no past medical history on file.   She has no past surgical history on file.   Her  family history includes Hypertension in her mother.  She   reports that she has quit smoking. She has never used smokeless tobacco. She reports that she drinks alcohol. She reports that she does not use illicit drugs.  Outpatient Prescriptions Prior to Visit  Medication Sig Dispense Refill  . Prenatal Vit-Fe Fumarate-FA (MULTIVITAMIN-PRENATAL) 27-0.8 MG TABS tablet Take 1 tablet by mouth daily at 12 noon.    . clotrimazole-betamethasone (LOTRISONE) cream Apply 1 application topically 2 (two) times daily. (Patient not taking: Reported on 08/06/2015) 30 g 1  . ibuprofen (ADVIL,MOTRIN) 600 MG tablet Take 1 tablet (600 mg total) by mouth every 8 (eight) hours as needed. (Patient not taking: Reported on 01/28/2015) 30 tablet 0   No facility-administered medications prior to visit.    Social History   Social History  . Marital Status: Married    Spouse Name: N/A  . Number of Children: N/A  . Years of Education: N/A   Social History Main Topics  . Smoking status: Former Research scientist (life sciences)  . Smokeless tobacco: Never Used  . Alcohol Use: 0.0 oz/week    0 Standard drinks or equivalent per week     Comment: occ  . Drug Use: No  . Sexual Activity: Not Asked   Other Topics Concern  . None   Social History Narrative     Review of  Systems  Constitutional: Negative for fever, chills and appetite change.  HENT: Negative for congestion, ear pain, postnasal drip, sinus pressure and sore throat.   Eyes: Negative for pain and redness.  Respiratory: Negative for cough, shortness of breath and wheezing.   Cardiovascular: Negative for leg swelling.  Gastrointestinal: Negative for nausea, vomiting, abdominal pain, diarrhea, constipation and blood in stool.  Endocrine: Negative for polyuria.  Genitourinary: Negative for dysuria, urgency, frequency and flank pain.  Musculoskeletal: Negative for gait problem.  Skin: Negative for rash.  Neurological: Negative for weakness and headaches.  Psychiatric/Behavioral: Negative for confusion and decreased concentration. The patient is not nervous/anxious.     Objective:  BP 108/62 mmHg  Pulse 92  Temp(Src) 98.2 F (36.8 C) (Oral)  Resp 18  Ht 5\' 4"  (1.626 m)  Wt 175 lb (79.379 kg)  BMI 30.02 kg/m2  SpO2 99%  LMP 01/10/2015  Physical Exam  Constitutional: She is oriented to person, place, and time. She appears well-developed and well-nourished.  HENT:  Head: Normocephalic and atraumatic.  Eyes: Conjunctivae are normal. Pupils are equal, round, and reactive to light.  Pulmonary/Chest: Effort normal.  Musculoskeletal: She exhibits no edema.  Neurological: She is alert and oriented to person, place, and time.  Skin: Skin is dry.   She has a small sebaceous cyst on the right midabdomen but the size of a pencil  eraser no evidence cellulitis or draining  Psychiatric: She has a normal mood and affect. Her behavior is normal. Thought content normal.      Assessment & Plan:   Brandace was seen today for insect bite.  Diagnoses and all orders for this visit:  Sebaceous cyst  I am having Ms. Bredeson maintain her ibuprofen, multivitamin-prenatal, clotrimazole-betamethasone, ferrous fumarate, and vitamin C.  Meds ordered this encounter  Medications  . ferrous fumarate  (HEMOCYTE - 106 MG FE) 325 (106 FE) MG TABS tablet    Sig: Take 1 tablet by mouth.  . Ascorbic Acid (VITAMIN C) 100 MG tablet    Sig: Take 100 mg by mouth daily.    Appropriate red flag conditions were discussed with the patient as well as actions that should be taken.  Patient expressed his understanding.  Follow-up: Return if symptoms worsen or fail to improve.  Roselee Culver, MD

## 2015-09-08 ENCOUNTER — Encounter (HOSPITAL_COMMUNITY): Payer: Self-pay | Admitting: *Deleted

## 2015-09-08 ENCOUNTER — Inpatient Hospital Stay (HOSPITAL_COMMUNITY)
Admission: AD | Admit: 2015-09-08 | Discharge: 2015-09-11 | DRG: 775 | Disposition: A | Discharge status: Home / Self Care | Payer: Managed Care, Other (non HMO) | Source: Ambulatory Visit | Attending: Obstetrics and Gynecology | Admitting: Obstetrics and Gynecology

## 2015-09-08 DIAGNOSIS — Z3480 Encounter for supervision of other normal pregnancy, unspecified trimester: Secondary | ICD-10-CM

## 2015-09-08 DIAGNOSIS — O42913 Preterm premature rupture of membranes, unspecified as to length of time between rupture and onset of labor, third trimester: Secondary | ICD-10-CM | POA: Diagnosis present

## 2015-09-08 DIAGNOSIS — Z8249 Family history of ischemic heart disease and other diseases of the circulatory system: Secondary | ICD-10-CM

## 2015-09-08 DIAGNOSIS — Z3A34 34 weeks gestation of pregnancy: Secondary | ICD-10-CM

## 2015-09-08 DIAGNOSIS — Z87891 Personal history of nicotine dependence: Secondary | ICD-10-CM

## 2015-09-08 HISTORY — DX: Unspecified abnormal cytological findings in specimens from vagina: R87.629

## 2015-09-08 HISTORY — DX: Headache: R51

## 2015-09-08 HISTORY — DX: Gastro-esophageal reflux disease without esophagitis: K21.9

## 2015-09-08 HISTORY — DX: Headache, unspecified: R51.9

## 2015-09-08 LAB — POCT FERN TEST: POCT Fern Test: NEGATIVE

## 2015-09-08 LAB — AMNISURE RUPTURE OF MEMBRANE (ROM) NOT AT ARMC: AMNISURE: POSITIVE

## 2015-09-08 MED ORDER — PROMETHAZINE HCL 25 MG/ML IJ SOLN
12.5000 mg | Freq: Once | INTRAMUSCULAR | Status: AC
  Administered 2015-09-09: 12.5 mg via INTRAMUSCULAR
  Filled 2015-09-08: qty 1

## 2015-09-08 MED ORDER — FENTANYL CITRATE (PF) 100 MCG/2ML IJ SOLN
100.0000 ug | Freq: Once | INTRAMUSCULAR | Status: AC
Start: 2015-09-09 — End: 2015-09-09
  Administered 2015-09-09: 100 ug via INTRAVENOUS
  Filled 2015-09-08: qty 2

## 2015-09-08 MED ORDER — LACTATED RINGERS IV BOLUS (SEPSIS)
1000.0000 mL | Freq: Once | INTRAVENOUS | Status: AC
  Administered 2015-09-08: 1000 mL via INTRAVENOUS

## 2015-09-08 MED ORDER — BETAMETHASONE SOD PHOS & ACET 6 (3-3) MG/ML IJ SUSP
12.0000 mg | Freq: Once | INTRAMUSCULAR | Status: AC
  Administered 2015-09-09: 12 mg via INTRAMUSCULAR
  Filled 2015-09-08: qty 2

## 2015-09-08 NOTE — MAU Note (Signed)
Pt was in bed and felt something come out.  Got out of bed and felt liquid come running down her legs and made a puddle on the floor.  Occasional ctx throughout the day and very mild.  States the ctx's are much stronger since shes arrived in MAU.  Reports good fetal movement now but had not felt any movement all day til 1645.

## 2015-09-08 NOTE — MAU Note (Signed)
Pt reports ROM at 2100, contractions off/on all day.

## 2015-09-08 NOTE — MAU Provider Note (Signed)
History     CSN: [id number]  Arrival date and time: 09/08/15 2138   First Provider Initiated Contact with Patient 09/08/15 2238      Chief Complaint  Patient presents with  . Contractions  . Rupture of Membranes   HPI Comments: Wanda Moreno is a 39 y.o. G2P0101 at [redacted]w[redacted]d who presents today with leaking of fluid. She states that around 2100 she had a gush of clear fluid. She denies any VB, and confirms fetal movement. She is having contractions. She rates her contractions 6/10. She feels like they are getting stronger since being here.    Past Medical History  Diagnosis Date  . Vaginal Pap smear, abnormal   . Headache   . GERD (gastroesophageal reflux disease)   . Chronic kidney disease     kidney stones at age 19    Past Surgical History  Procedure Laterality Date  . Wisdom tooth extraction      Family History  Problem Relation Age of Onset  . Hypertension Mother     Social History  Substance Use Topics  . Smoking status: Former Smoker    Quit date: 01/07/2015  . Smokeless tobacco: Never Used  . Alcohol Use: 0.0 oz/week    0 Standard drinks or equivalent per week     Comment: occ, but not while preg    Allergies: No Known Allergies  Prescriptions prior to admission  Medication Sig Dispense Refill Last Dose  . Ascorbic Acid (VITAMIN C) 100 MG tablet Take 100 mg by mouth daily.   09/08/2015 at Unknown time  . calcium carbonate (TUMS - DOSED IN MG ELEMENTAL CALCIUM) 500 MG chewable tablet Chew 2 tablets by mouth 2 (two) times daily as needed for indigestion or heartburn.   09/07/2015 at Unknown time  . ferrous fumarate (HEMOCYTE - 106 MG FE) 325 (106 FE) MG TABS tablet Take 1 tablet by mouth.   09/08/2015 at Unknown time  . Prenatal Vit-Fe Fumarate-FA (PRENATAL MULTIVITAMIN) TABS tablet Take 1 tablet by mouth at bedtime.   09/08/2015 at Unknown time  . ranitidine (ZANTAC) 150 MG tablet Take 150 mg by mouth daily as needed for heartburn.   Past Week at  Unknown time    ROS Physical Exam   Blood pressure 131/79, pulse 86, temperature 98.6 F (37 C), temperature source Oral, resp. rate 16, height 5\' 4"  (1.626 m), weight 85.276 kg (188 lb), last menstrual period 01/10/2015, SpO2 98 %.  Physical Exam  Nursing note and vitals reviewed. Constitutional: She appears well-developed and well-nourished. No distress.  HENT:  Head: Normocephalic.  Cardiovascular: Normal rate.   Respiratory: Effort normal.  GI: Soft. There is no tenderness. There is no rebound.  Genitourinary:   External: no lesion Vagina: pooling of clear fluid  Cervix: pink, smooth, 1/90/-1/vtx  Uterus: AGA    Neurological: She is alert.  Skin: Skin is warm.  Psychiatric: She has a normal mood and affect.     Results for orders placed or performed during the hospital encounter of 09/08/15 (from the past 24 hour(s))  Fern Test     Status: None   Collection Time: 09/08/15 10:31 PM  Result Value Ref Range   POCT Fern Test Negative = intact amniotic membranes   Amnisure rupture of membrane (rom)not at [facility]     Status: None   Collection Time: 09/08/15 11:00 PM  Result Value Ref Range   Amnisure ROM POSITIVE    FHT: 135, moderate with 15x15 accels, variable decel Toco: q3-4  mins, but not tracing on the monitor  MAU Course  Procedures  MDM D/W Dr. Ouida Sills. He would like the patient to have an amnisure. 2357: Patient is very uncomfortable, and vocalizing with contractions. D/W Dr. Ouida Sills again. He has not gotten in touch with NICU yet. Advised him that we cannot transfer the patient now. She is very uncomfortable. Dr. Ouida Sills gave admission orders, and he will try to contact NICU again.    Assessment and Plan  PPROM Dr. Ouida Sills will call the NICU for admission 2/2 high census  Mathis Bud 09/08/2015, 10:39 PM

## 2015-09-09 ENCOUNTER — Inpatient Hospital Stay (HOSPITAL_COMMUNITY): Payer: Managed Care, Other (non HMO) | Admitting: Anesthesiology

## 2015-09-09 ENCOUNTER — Encounter (HOSPITAL_COMMUNITY): Payer: Self-pay

## 2015-09-09 DIAGNOSIS — Z87891 Personal history of nicotine dependence: Secondary | ICD-10-CM | POA: Diagnosis not present

## 2015-09-09 DIAGNOSIS — Z3480 Encounter for supervision of other normal pregnancy, unspecified trimester: Secondary | ICD-10-CM

## 2015-09-09 DIAGNOSIS — O42913 Preterm premature rupture of membranes, unspecified as to length of time between rupture and onset of labor, third trimester: Secondary | ICD-10-CM | POA: Diagnosis present

## 2015-09-09 DIAGNOSIS — Z8249 Family history of ischemic heart disease and other diseases of the circulatory system: Secondary | ICD-10-CM | POA: Diagnosis not present

## 2015-09-09 DIAGNOSIS — Z3A34 34 weeks gestation of pregnancy: Secondary | ICD-10-CM | POA: Diagnosis not present

## 2015-09-09 LAB — CBC
HCT: 32.7 % — ABNORMAL LOW (ref 36.0–46.0)
HEMOGLOBIN: 11.2 g/dL — AB (ref 12.0–15.0)
MCH: 29.2 pg (ref 26.0–34.0)
MCHC: 34.3 g/dL (ref 30.0–36.0)
MCV: 85.2 fL (ref 78.0–100.0)
PLATELETS: 187 10*3/uL (ref 150–400)
RBC: 3.84 MIL/uL — AB (ref 3.87–5.11)
RDW: 12.8 % (ref 11.5–15.5)
WBC: 13.7 10*3/uL — ABNORMAL HIGH (ref 4.0–10.5)

## 2015-09-09 LAB — HIV ANTIBODY (ROUTINE TESTING W REFLEX): HIV Screen 4th Generation wRfx: NONREACTIVE

## 2015-09-09 LAB — TYPE AND SCREEN
ABO/RH(D): O POS
ANTIBODY SCREEN: NEGATIVE

## 2015-09-09 LAB — ABO/RH: ABO/RH(D): O POS

## 2015-09-09 LAB — RPR: RPR Ser Ql: NONREACTIVE

## 2015-09-09 MED ORDER — EPHEDRINE 5 MG/ML INJ
10.0000 mg | INTRAVENOUS | Status: DC | PRN
  Filled 2015-09-09: qty 2

## 2015-09-09 MED ORDER — ACETAMINOPHEN 325 MG PO TABS: 650.0000 mg | ORAL_TABLET | ORAL | Status: DC | PRN

## 2015-09-09 MED ORDER — SENNOSIDES-DOCUSATE SODIUM 8.6-50 MG PO TABS
2.0000 | ORAL_TABLET | ORAL | Status: DC
  Administered 2015-09-10: 2 via ORAL
  Filled 2015-09-09 (×2): qty 2

## 2015-09-09 MED ORDER — PHENYLEPHRINE 40 MCG/ML (10ML) SYRINGE FOR IV PUSH (FOR BLOOD PRESSURE SUPPORT)
80.0000 ug | PREFILLED_SYRINGE | INTRAVENOUS | Status: DC | PRN
  Filled 2015-09-09: qty 2
  Filled 2015-09-09: qty 20

## 2015-09-09 MED ORDER — OXYCODONE-ACETAMINOPHEN 5-325 MG PO TABS: 2.0000 | ORAL_TABLET | ORAL | Status: DC | PRN

## 2015-09-09 MED ORDER — DEXTROSE 5 % IV SOLN
2.5000 10*6.[IU] | INTRAVENOUS | Status: DC
  Filled 2015-09-09 (×4): qty 2.5

## 2015-09-09 MED ORDER — PENICILLIN G POTASSIUM 5000000 UNITS IJ SOLR
5.0000 10*6.[IU] | Freq: Once | INTRAVENOUS | Status: DC
  Filled 2015-09-09: qty 5

## 2015-09-09 MED ORDER — LIDOCAINE HCL (PF) 1 % IJ SOLN
30.0000 mL | INTRAMUSCULAR | Status: DC | PRN
  Filled 2015-09-09: qty 30

## 2015-09-09 MED ORDER — DIPHENHYDRAMINE HCL 50 MG/ML IJ SOLN: 12.5000 mg | INTRAMUSCULAR | Status: DC | PRN

## 2015-09-09 MED ORDER — ONDANSETRON HCL 4 MG/2ML IJ SOLN: 4.0000 mg | INTRAMUSCULAR | Status: DC | PRN

## 2015-09-09 MED ORDER — SODIUM CHLORIDE 0.9 % IV SOLN
2.0000 g | Freq: Four times a day (QID) | INTRAVENOUS | Status: DC
  Administered 2015-09-09: 2 g via INTRAVENOUS
  Filled 2015-09-09 (×3): qty 2000

## 2015-09-09 MED ORDER — SIMETHICONE 80 MG PO CHEW: 80.0000 mg | CHEWABLE_TABLET | ORAL | Status: DC | PRN

## 2015-09-09 MED ORDER — ONDANSETRON HCL 4 MG/2ML IJ SOLN: 4.0000 mg | Freq: Four times a day (QID) | INTRAMUSCULAR | Status: DC | PRN

## 2015-09-09 MED ORDER — TETANUS-DIPHTH-ACELL PERTUSSIS 5-2.5-18.5 LF-MCG/0.5 IM SUSP: 0.5000 mL | Freq: Once | INTRAMUSCULAR | Status: DC

## 2015-09-09 MED ORDER — DIBUCAINE 1 % RE OINT: 1.0000 "application " | TOPICAL_OINTMENT | RECTAL | Status: DC | PRN

## 2015-09-09 MED ORDER — IBUPROFEN 600 MG PO TABS
600.0000 mg | ORAL_TABLET | Freq: Four times a day (QID) | ORAL | Status: DC
Start: 2015-09-09 — End: 2015-09-11
  Administered 2015-09-09 – 2015-09-11 (×8): 600 mg via ORAL
  Filled 2015-09-09 (×8): qty 1

## 2015-09-09 MED ORDER — LACTATED RINGERS IV SOLN: 500.0000 mL | INTRAVENOUS | Status: DC | PRN

## 2015-09-09 MED ORDER — ONDANSETRON HCL 4 MG PO TABS
4.0000 mg | ORAL_TABLET | ORAL | Status: DC | PRN
Start: 2015-09-09 — End: 2015-09-11

## 2015-09-09 MED ORDER — OXYCODONE-ACETAMINOPHEN 5-325 MG PO TABS: 1.0000 | ORAL_TABLET | ORAL | Status: DC | PRN

## 2015-09-09 MED ORDER — WITCH HAZEL-GLYCERIN EX PADS: 1.0000 "application " | MEDICATED_PAD | CUTANEOUS | Status: DC | PRN

## 2015-09-09 MED ORDER — FENTANYL 2.5 MCG/ML BUPIVACAINE 1/10 % EPIDURAL INFUSION (WH - ANES)
14.0000 mL/h | INTRAMUSCULAR | Status: DC | PRN
  Filled 2015-09-09: qty 125

## 2015-09-09 MED ORDER — LACTATED RINGERS IV SOLN: INTRAVENOUS | Status: DC

## 2015-09-09 MED ORDER — OXYTOCIN BOLUS FROM INFUSION: 500.0000 mL | INTRAVENOUS | Status: DC

## 2015-09-09 MED ORDER — MEASLES, MUMPS & RUBELLA VAC ~~LOC~~ INJ
0.5000 mL | INJECTION | Freq: Once | SUBCUTANEOUS | Status: DC
  Filled 2015-09-09: qty 0.5

## 2015-09-09 MED ORDER — BENZOCAINE-MENTHOL 20-0.5 % EX AERO
1.0000 "application " | INHALATION_SPRAY | CUTANEOUS | Status: DC | PRN
  Administered 2015-09-09: 1 via TOPICAL
  Filled 2015-09-09: qty 56

## 2015-09-09 MED ORDER — ZOLPIDEM TARTRATE 5 MG PO TABS: 5.0000 mg | ORAL_TABLET | Freq: Every evening | ORAL | Status: DC | PRN

## 2015-09-09 MED ORDER — OXYTOCIN 40 UNITS IN LACTATED RINGERS INFUSION - SIMPLE MED
62.5000 mL/h | INTRAVENOUS | Status: DC
  Administered 2015-09-09: 62.5 mL/h via INTRAVENOUS
  Filled 2015-09-09: qty 1000

## 2015-09-09 MED ORDER — CITRIC ACID-SODIUM CITRATE 334-500 MG/5ML PO SOLN: 30.0000 mL | ORAL | Status: DC | PRN

## 2015-09-09 NOTE — Lactation Note (Signed)
This note was copied from the chart of [place]. Lactation Consultation Note  Patient Name: Boy Elideth Ramlall [id number] Date: 09/09/2015 Reason for consult: Initial assessment;NICU baby;Infant < 6lbs;Late preterm infant   Maternal Data Formula Feeding for Exclusion: Yes (baby in NICU) Has patient been taught Hand Expression?: Yes Does the patient have breastfeeding experience prior to this delivery?: Yes  Feeding Feeding Type: Formula  LATCH Score/Interventions                      Lactation Tools Discussed/Used WIC Program: No Pump Review: Setup, frequency, and cleaning;Milk Storage;Other (comment) (hand expression, NICU booklet review, initiation setting on pump) Initiated by:: bedside rn Date initiated:: 09/09/15   Consult Status Consult Status: Follow-up Date: 09/10/15 Follow-up type: In-patient    Tonna Corner 09/09/2015, 1:59 PM

## 2015-09-09 NOTE — Consult Note (Signed)
Neonatology Note:   Attendance at C-section:    I was asked by Dr. Ouida Sills to attend this NSVD at 34 4/7 weeks after SROM and onset of PTL. The mother is a G2P1 O pos, GBS not done yet with a history of preterm delivery. ROM 4 hours prior to delivery, fluid clear. Infant vigorous with good spontaneous cry and tone. Needed only minimal bulb suctioning. We placed a pulse oximeter and the O2 saturations were 55% in room air at 5 minutes, so BBO2 was given, followed by neopuff CPAP. The O2 saturations came up to expected parameters. Lungs with decreased air exchange, improved after CPAP. Ap 8/8/9. He was seen briefly by his parents, then was transported to the NICU for further care, with his father in attendance.   Real Cons, MD

## 2015-09-09 NOTE — H&P (Signed)
Pt is a 39 y/o white female G2P0101 at 39 4/7 wks who presented to the ER with c/o SROM. In the ER she had +pool and +amniosure. PNC was complicated by a h.o.a past premature delivery. She was offered [facility] and declined. She also has AMA, declined NIPT, nl first trimester screen. Rubella non immune. PMHX: See [place] PE: VSSAF        HEENT-wnl        ABD-gravid, palp contractions.  IMP/ IUP at 34 + wks         PROM         AMA         Rubella non immune Plan/ Admit           Ampicillin

## 2015-09-09 NOTE — Lactation Note (Signed)
This note was copied from the chart of [place]. Lactation Consultation Note  Patient Name: Wanda Moreno [id number] Date: 09/09/2015 Reason for consult: Initial assessment   With this mom of a NICU baby, now 62 hours old, and 34 4/[redacted] weeks gestation. Mom is pumping, and was taught how to hand express, and to set initiation setting. I decreased mom to size 21 flanges with a good fit. Mom was able to express at least 1 ml of colostrum, which she will bring to her baby.  Mom is going to call her insurance for a DEP, and will need a 2 week loaner at discharge. Mom knows to call for questions/concerns.    Maternal Data Formula Feeding for Exclusion: Yes (baby in NICU) Has patient been taught Hand Expression?: Yes Does the patient have breastfeeding experience prior to this delivery?: Yes  Feeding Feeding Type: Formula  LATCH Score/Interventions                      Lactation Tools Discussed/Used WIC Program: No Pump Review: Setup, frequency, and cleaning;Milk Storage;Other (comment) (hand expression, NICU booklet review, initiation setting on pump) Initiated by:: bedside rn Date initiated:: 09/09/15   Consult Status Consult Status: Follow-up Date: 09/10/15 Follow-up type: In-patient    Tonna Corner 09/09/2015, 1:54 PM

## 2015-09-09 NOTE — Progress Notes (Signed)
Patient is eating, ambulating, voiding.  Pain control is good.  Filed Vitals:   09/09/15 0226 09/09/15 0240 09/09/15 0349 09/09/15 0740  BP: 138/73 134/71 118/58 113/49  Pulse: 86 67 78 68  Temp:  98.7 F (37.1 C) 98.9 F (37.2 C) 98.9 F (37.2 C)  TempSrc:  Oral Oral Oral  Resp: 16 16 16 18   Height:      Weight:      SpO2:  100% 100% 98%    Fundus firm Perineum without swelling.  Lab Results  Component Value Date   WBC 13.7* 09/08/2015   HGB 11.2* 09/08/2015   HCT 32.7* 09/08/2015   MCV 85.2 09/08/2015   PLT 187 09/08/2015    --/--/O POS, O POS (12/12 2345)/RI  A/P Post partum day 0.  Routine care.  Expect d/c routine.  Baby doing ok in NICU.    Mikell Camp A

## 2015-09-09 NOTE — Anesthesia Preprocedure Evaluation (Addendum)
Anesthesia Evaluation  Patient identified by MRN, date of birth, ID band Patient awake    Reviewed: Allergy & Precautions, NPO status , Patient's Chart, lab work & pertinent test results  Airway Mallampati: II  TM Distance: >3 FB Neck ROM: Full    Dental  (+) Teeth Intact, Dental Advisory Given   Pulmonary former smoker,    Pulmonary exam normal breath sounds clear to auscultation       Cardiovascular Exercise Tolerance: Good negative cardio ROS Normal cardiovascular exam Rhythm:Regular Rate:Normal     Neuro/Psych  Headaches,    GI/Hepatic Neg liver ROS, GERD  Medicated,  Endo/Other  Obesity   Renal/GU negative Renal ROS     Musculoskeletal negative musculoskeletal ROS (+)   Abdominal   Peds  Hematology  (+) Blood dyscrasia, anemia , Plt 187k    Anesthesia Other Findings Day of surgery medications reviewed with the patient.  Reproductive/Obstetrics (+) Pregnancy                          Anesthesia Physical Anesthesia Plan  ASA: II  Anesthesia Plan: Epidural   Post-op Pain Management:    Induction:   Airway Management Planned:   Additional Equipment:   Intra-op Plan:   Post-operative Plan:   Informed Consent: I have reviewed the patients History and Physical, chart, labs and discussed the procedure including the risks, benefits and alternatives for the proposed anesthesia with the patient or authorized representative who has indicated his/her understanding and acceptance.   Dental advisory given  Plan Discussed with:   Anesthesia Plan Comments: (On arrival to room, patient actively pushing with OBGYN at bedside.  Epidural request canceled.)       Anesthesia Quick Evaluation

## 2015-09-10 NOTE — Progress Notes (Signed)
Patient ID: Wanda Moreno, female   DOB: [birth year], 39 y.o.   MRN: [id number]  Pt not in room

## 2015-09-10 NOTE — Progress Notes (Signed)
CSW attempted to meet with parents in MOB's third floor room to complete assessment due to baby's admission to NICU, but they were not present at this time.  CSW will attempt again at a later time.

## 2015-09-10 NOTE — Lactation Note (Signed)
This note was copied from the chart of [place]. Lactation Consultation Note  Patient Name: Wanda Moreno [id number] Date: 09/10/2015 Reason for consult: Follow-up assessment;NICU baby NICU baby 3 hours old, [redacted]w[redacted]d CGA. Mom using DEBP when this [place] entered the room. Mom reports that she has just come back from holding the baby STS. Mom has taken EBM to NICU 5 times, and is getting over 5 ml with this pumping session. Mom aware of pumping rooms in NICU, and enc to call her insurance company for a DEBP. Mom given paperwork to obtain a 2-week DEBP rental from [facility]. Discussed normal progression of milk coming to volume. Mom has an 37 year old, whom she did not nurse but for whom she attempted to pump. Mom states that she remembers being engorged with first child. Enc mom to pump for 8 times/24 hours followed by hand expression. Enc mom to call for assistance as needed.   Maternal Data    Feeding Feeding Type: Breast Milk with Formula added Length of feed: 30 min  LATCH Score/Interventions                      Lactation Tools Discussed/Used     Consult Status Consult Status: Follow-up Date: 09/11/15 Follow-up type: In-patient    Inocente Salles 09/10/2015, 11:39 AM

## 2015-09-11 NOTE — Progress Notes (Signed)
Pt is discharged in the care of husband,with N.T. Escort. Discharged instructions with  Rx were given to pt with good understanding. Spirits are good.  Denies any  Pain or discomfort. Infant to remain in NICu.

## 2015-09-11 NOTE — Clinical Social Work Maternal (Signed)
CLINICAL SOCIAL WORK MATERNAL/CHILD NOTE  Patient Details  Name: Wanda Moreno MRN: [id number] Date of Birth: 05/29/1976  Date:  04/04/15  Clinical Social Worker Initiating Note:  Darcie Mellone E. Brigitte Pulse, [place] Date/ Time Initiated:  09/11/15/1005     Child's Name:  Franky Macho   Legal Guardian:   (Parents: Larene Beach and Darci Current)   Need for Interpreter:  None   Date of Referral:        Reason for Referral:   (No referral-NICU admission)   Referral Source:      Address:  [address] [place], [place] [address]  Phone number:  [phone]   Household Members:  Spouse, Adult Children (MOB has a 70 year old son from a previous marriage.)   Natural Supports (not living in the home):  Extended Family, Immediate Family (MOB reports feeling closer to FOB's family and states they are a great support, though 4 hours away.)   Professional Supports:     Employment:     Type of Work:  (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  FOB works in Theatre manager for [employer].)   Education:      Museum/gallery curator Resources:  [employer]   Other Resources:      Cultural/Religious Considerations Which May Impact Care: None stated.  MOB's facesheet states religion as Victoria referred to praying numerous times throughout the conversation.    Strengths:  Ability to meet basic needs , Compliance with medical plan , Understanding of illness, Home prepared for child  (MOB states she took her 1st son to [facility], but this office is not convenient to their home.  CSW advised she get a list from the NICU. )   Risk Factors/Current Problems:  None   Cognitive State:  Alert , Linear Thinking , Goal Oriented , Insightful    Mood/Affect:  Calm , Comfortable , Tearful , Fearful    CSW Assessment: CSW met with MOB in her third floor room/319 to introduce services, offer support and complete assessment due to baby's admission to NICU at 34.4  weeks.  MOB was extremely pleasant and welcoming to CSW's visit.  She was talkative and seemed appreciative of the space to share her story.  She became tearful at times when she spoke about her worry and fear surrounding baby's oxygen need and her inability to carry to term, but was overall upbeat and positive.  CSW provided supportive brief counseling to assist her in processing her feelings. MOB shared her frustrations with her birth experience, which CSW encouraged her to talk with her doctor about.  She talked about feeling comfortable with baby's care and states that the NICU team has been great about answering her questions and keeping her informed.  She states she appreciates that the approach to her baby's care has not been too conservative or extreme in her opinion.  MOB reports having a NICU experience with her first child, now 71, when he was born at 56 weeks when she was 8.  She states she was hoping and praying she would carry this baby to term and not require a NICU stay.  She states there is something that makes her feel "less of a woman" for her inability to carry to full term.  CSW reminded her that she had already stated that she delivered both babies without pain medication.  CSW asked her if there was anything she could have done to prevent her water breaking at  34.4 weeks and she said "I don't know."  CSW advised that these things just happen.  She seemed appreciative of the conversation and support offered.  She later stated that she feels she should have done the 17P injections so that maybe she would have carried to term.  CSW suggested that thinking of things she could have done does not change the current situation.  CSW also noted that there was no guarantee that she would have carried to term had she elected to get the injections.  Still, MOB is struggling with feelings of guilt and inadequacy.  She often compared her babies and NICU experiences and stated concern for Colin's need for  more oxygen support than her first baby, who was born nearly 5 weeks earlier.  She questions whether baby's current breathing problems are indication of long term needs.  CSW encouraged her to ask baby's MD this question as they can speak to what statistics show.  MOB states she does not want to bother the doctors.  CSW assured MOB that the doctors are available for questions and support as needed.  MOB seemed reassured. MOB spoke at length about her family and support system.  She reports, "I don't choose to be around my family."  It sounds as though her mom, dad and step mom are involved in her life to some degree, but that she does not find them supportive.  She states she feels much closer to her husband's family and considers his mother an aunt as maternal figures to her.  She states concern regarding visitation in the NICU.  CSW encouraged MOB as her son's greatest advocate to do whatever she feels is best for her family, even if that means asking her family not to visit while baby is in the NICU.  CSW added that those who are supportive will understand and that it is not MOB's responsibility to please everyone.  CSW informed MOB of the [facility] website to help with updating family and friends.  MOB spoke about her positive relationship with her husband and how they desperately wanted this baby, who was difficult to conceive.  She states her current husband is not the father of her first child.  She reports getting married in the hospital the day before her first son was born and that it was "always a bad situation" with her first husband.  She states thankfulness for her current relationship. CSW discussed common emotions often experienced in the first few weeks after delivery, keeping in mind the stress that a NICU admission brings.  CSW provided education regarding perinatal mood disorders in great detail as MOB asked questions and shared about a friend who experienced it.  CSW encourages MOB to call  CSW and or her doctor if she has concerns about her mental health at any time.   CSW explained ongoing support services offered by NICU CSW and gave contact information.  MOB seemed very appreciative of the visit and knows she can call any time.  CSW Plan/Description:  Patient/Family Education , Psychosocial Support and Ongoing Assessment of Needs    Deltha Bernales Elizabeth, LCSW 09/11/2015, 12:27 PM 

## 2015-09-11 NOTE — Lactation Note (Signed)
This note was copied from the chart of [place]. Lactation Consultation Note  Patient Name: Boy Sharee Sturdy [id number] Date: 09/11/2015 Reason for consult: Follow-up assessment;NICU baby Met with mom in her room just prior to discharge. Mom given a 2-week DEBP rental. Assisted mom with hand expressing and assessed mom's pump flanges. Enc mom to use #24 for now if more comfortable. Discussed that after her edema reduces and after milk comes to volume, she may need to use #21 flanges again. Discuss comfort and needing only nipple in flange while pumping. Discussed hands-free pumping with modified sports bra and benefits of hand massaging breasts. Discussed engorgement prevention/treatment. Answered all questions regarding pumping, diet, EBM storage and transportation. Mom aware of OP/BFSG and [place] phone line assistance after D/C. Enc mom to offer STS and nuzzling/latching at breast in NICU as well.   Maternal Data    Feeding    LATCH Score/Interventions                      Lactation Tools Discussed/Used     Consult Status Consult Status: Complete    Inocente Salles 09/11/2015, 1:51 PM

## 2015-09-11 NOTE — Discharge Summary (Signed)
Obstetric Discharge Summary Reason for Admission: onset of labor Prenatal Procedures: ultrasound Intrapartum Procedures: spontaneous vaginal delivery Postpartum Procedures: none Complications-Operative and Postpartum: none HEMOGLOBIN  Date Value Ref Range Status  09/08/2015 11.2* 12.0 - 15.0 g/dL Final  12/31/2014 13.4 12.2 - 16.2 g/dL Final   HCT  Date Value Ref Range Status  09/08/2015 32.7* 36.0 - 46.0 % Final   HCT, POC  Date Value Ref Range Status  12/31/2014 41.6 37.7 - 47.9 % Final    Physical Exam:  General: alert and cooperative Lochia: appropriate Uterine Fundus: firm DVT Evaluation: No evidence of DVT seen on physical exam.  Discharge Diagnoses: Premature labor  Discharge Information: Date: 09/11/2015 Activity: pelvic rest Diet: routine Medications: PNV and Ibuprofen Condition: stable Instructions: refer to practice specific booklet Discharge to: home Follow-up Information    Follow up with Olga Millers, MD In 4 weeks.   Specialty:  Obstetrics and Gynecology   Contact information:   [place] [phone] [phone]       Newborn Data: Live born female  Birth Weight: 4 lb 13.3 oz (2190 g) APGAR: 8, 8  Baby in NICU after 34 wk delivery  Omara Alcon 09/11/2015, 11:18 AM

## 2015-09-19 ENCOUNTER — Ambulatory Visit: Payer: Self-pay

## 2015-09-19 NOTE — Lactation Note (Signed)
This note was copied from the chart of [place]. Lactation Consultation Note  Patient Name: Wanda Moreno [id number] Date: 09/19/2015 Reason for consult: Follow-up assessment;NICU baby;Late preterm infant;Infant < 6lbs   Follow up with mom per her request. Mom reports she is pumping but not every 3 hours. Discussed supply and demand and need to maintain regular every 2-3 hour pumping to maintain supply. Mom reports she is pumping about 50-60 cc /pumping. Infant not 93 days old and is going to breast some. Mom reports he does latch and sucks. She is worried that he is not getting enough. Discussed LPT infant feeding behavior and need to continue pumping even though infant feeds at breast to maintain supply. Enc mom to pump at infant bedside as needed to get pumping in. Mom to be here tomorrow for 12-3-6 feeds and wants assistance with a feeding. Discussed I would leave a not for a LC to come and assess as able , otherwise NICU LC will be here Monday. Enc mom to also pump in car on trips to and from hospital once she gets her PIS. Enc mom to call with questions/concerns.   Maternal Data Formula Feeding for Exclusion: No Has patient been taught Hand Expression?: Yes  Feeding Feeding Type: Breast Fed Length of feed: 30 min  LATCH Score/Interventions Latch: Repeated attempts needed to sustain latch, nipple held in mouth throughout feeding, stimulation needed to elicit sucking reflex.  Audible Swallowing: A few with stimulation  Type of Nipple: Everted at rest and after stimulation  Comfort (Breast/Nipple): Soft / non-tender     Hold (Positioning): No assistance needed to correctly position infant at breast.  LATCH Score: 8  Lactation Tools Discussed/Used Pump Review: Setup, frequency, and cleaning   Consult Status Consult Status: PRN Follow-up type: Call as needed    [name] 09/19/2015, 4:26 PM

## 2015-09-22 ENCOUNTER — Ambulatory Visit: Payer: Self-pay

## 2015-09-22 NOTE — Lactation Note (Signed)
This note was copied from the chart of [place]. Lactation Consultation Note  Patient Name: Wanda Moreno [id number] Date: 09/22/2015 Reason for consult: Follow-up assessment  with mom of a NICU baby, now 12 days old, and 36 3/7 weeks CGA. Mom has been breast feeding the baby while she visits during the day, while the baby gets ng fed. Yesterday, he transferred 20 ml's at the breast, with a pre and post weight. Mom is pumping about 7 ties a day, but goes up to 4 1/2 hours between pumping at times. I advised her to try and pump 8 times a day, and to hand express after each pumping. She is making about 700 ml's in 24 hours, which is a good amount. Mom very motivated to breast feed, and knows about o/p lactation after babies discharge to home.    Maternal Data    Feeding Feeding Type: Breast Fed  LATCH Score/Interventions Latch: Grasps breast easily, tongue down, lips flanged, rhythmical sucking. Intervention(s): Skin to skin;Teach feeding cues;Waking techniques Intervention(s): Adjust position;Assist with latch  Audible Swallowing: A few with stimulation Intervention(s): Hand expression  Type of Nipple: Everted at rest and after stimulation  Comfort (Breast/Nipple): Filling, red/small blisters or bruises, mild/mod discomfort  Problem noted: Filling  Hold (Positioning): Assistance needed to correctly position infant at breast and maintain latch. Intervention(s): Breastfeeding basics reviewed;Support Pillows;Position options;Skin to skin  LATCH Score: 7  Lactation Tools Discussed/Used Pump Review: Setup, frequency, and cleaning   Consult Status Consult Status: PRN Follow-up type: In-patient (NICU)    Tonna Corner 09/22/2015, 4:04 PM

## 2015-09-26 ENCOUNTER — Ambulatory Visit (INDEPENDENT_AMBULATORY_CARE_PROVIDER_SITE_OTHER): Payer: Managed Care, Other (non HMO) | Admitting: Family Medicine

## 2015-09-26 VITALS — BP 122/74 | HR 78 | Temp 97.8°F | Resp 16 | Ht 64.0 in | Wt 167.0 lb

## 2015-09-26 DIAGNOSIS — D229 Melanocytic nevi, unspecified: Secondary | ICD-10-CM

## 2015-09-26 NOTE — Progress Notes (Signed)
Subjective:    Patient ID: Wanda Moreno, female    DOB: [birth year], 39 y.o.   MRN: [id number] Chief Complaint  Patient presents with  . Nevus    right thigh, since last night    HPI  Normally seen [facility].  Pt has not had skin cancer prev and goes annually for full skin check in Oct.  Her mother, aunt, grandmother all have h/o squamous and basal cell carcinoma. Pt could not be seen there for a week for came in today.  Pt noticed a new lesion on her right thigh last night.  She has not having any pain and itching. Has not put anything on it. Has not had anything like this prior.  She had a baby 2 weeks ago, he is in the NICU - he was born in 79 and 4 weeks and so she is pumping a lot.   Past Medical History  Diagnosis Date  . Vaginal Pap smear, abnormal   . Headache   . GERD (gastroesophageal reflux disease)   . Chronic kidney disease     kidney stones at age 67   Current Outpatient Prescriptions on File Prior to Visit  Medication Sig Dispense Refill  . Ascorbic Acid (VITAMIN C) 100 MG tablet Take 100 mg by mouth daily.    . Prenatal Vit-Fe Fumarate-FA (PRENATAL MULTIVITAMIN) TABS tablet Take 1 tablet by mouth at bedtime.    . calcium carbonate (TUMS - DOSED IN MG ELEMENTAL CALCIUM) 500 MG chewable tablet Chew 2 tablets by mouth 2 (two) times daily as needed for indigestion or heartburn. Reported on 09/26/2015    . ranitidine (ZANTAC) 150 MG tablet Take 150 mg by mouth daily as needed for heartburn. Reported on 09/26/2015     No current facility-administered medications on file prior to visit.   No Known Allergies Depression screen [facility] 2/9 09/26/2015 08/06/2015 01/28/2015 01/28/2015 01/17/2015  Decreased Interest 0 0 0 0 0  Down, Depressed, Hopeless 0 0 0 0 0  PHQ - 2 Score 0 0 0 0 0        Review of Systems  Constitutional: Positive for fatigue. Negative for fever, chills and diaphoresis.  Musculoskeletal: Negative for joint swelling and  arthralgias.  Skin: Positive for color change and rash. Negative for pallor and wound.  Neurological: Negative for numbness.  Hematological: Negative for adenopathy.  Psychiatric/Behavioral: Positive for sleep disturbance and decreased concentration. Negative for dysphoric mood. The patient is nervous/anxious.        Objective:  BP 122/74 mmHg  Pulse 78  Temp(Src) 97.8 F (36.6 C)  Resp 16  Ht 5\' 4"  (1.626 m)  Wt 167 lb (75.751 kg)  BMI 28.65 kg/m2  SpO2 98%  LMP 01/10/2015  Physical Exam  Constitutional: She is oriented to person, place, and time. She appears well-developed and well-nourished. No distress.  HENT:  Head: Normocephalic and atraumatic.  Right Ear: External ear normal.  Eyes: Conjunctivae are normal. No scleral icterus.  Pulmonary/Chest: Effort normal.  Neurological: She is alert and oriented to person, place, and time.  Skin: Skin is warm and dry. She is not diaphoretic. No erythema.     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  Psychiatric: She has a normal mood and affect. Her behavior is normal.   Risks/benefits reviewed and verbal informed consent obtained.  Sterile procedure followed. Anesthesia with 1.5cc  of subcu 2% lidocaine with epi. Entire lesion removed with shave biopsy into dermis but not through using dermablade and path sent of approx 1 cm lesion. Hemostasis achieved with drysol. Pressure bandage applied and wound care reviewed. Pt tolerated procedure well w/o complication. EBL <2cc.       Assessment & Plan:   1. Nevus   Suspect benign but could be actinic keratoses.  Reassured but due to pt's stress and her family hx she just wants it all removed which I agree is reasonable as could be squamous cell ca though low on differential.  Path P. Will follow up with Dr. Tamala Julian at [place] as needed.  I  personally performed the services described in this documentation, which was scribed in my presence. The recorded information has been reviewed and considered, and addended by me as needed.  Delman Cheadle, MD MPH

## 2015-09-26 NOTE — Patient Instructions (Signed)
Change dressing daily, clean with normal soap and water. Apply ointment (no neosporin) until healed. Ok to use vitamin E or mederma to reduce scar.  If bleeding hold compression for 5 minutes no peaking. RTC immed for any increased pain, redness, swelling, or purulent drainage.  Skin Biopsy WHY AM I HAVING THIS TEST? This test examines skin tissue under a microscope. Your health care provider may perform this test to identify the source of a skin rash or lesion if an allergy is suspected. In this test, a tissue sample (biopsy) is taken to look at your skin's anatomy and to see if certain proteins and antibodies are present. WHAT KIND OF SAMPLE IS TAKEN? A small sample of skin is required for this test. It is usually collected by numbing an area of skin and removing a small circle of skin. HOW DO I PREPARE FOR THE TEST? There is no preparation required for this test. HOW ARE THE TEST RESULTS REPORTED? Your results may be reported in different ways and are dependent upon the kind of test that is performed. It is your responsibility to obtain your test results. Ask the lab or department performing the test when and how you will get your results. It is most common for your results to be reported as:  Normal skin anatomy (histology).  No evidence of IgG, IgA, or IgM antibody, complement C3, or fibrinogen. WHAT DO THE RESULTS MEAN? Abnormal findings may indicate certain autoimmune diseases. This test can produce many different results. It is important to talk with your health care provider to discuss your results, treatment options, and if necessary, the need for more tests. Talk with your health care provider if you have any questions about your results.   This information is not intended to replace advice given to you by your health care provider. Make sure you discuss any questions you have with your health care provider.   Document Released: 10/15/2004 Document Revised: 10/04/2014 Document  Reviewed: 12/11/2014 Elsevier Interactive Patient Education [employer].

## 2015-09-29 ENCOUNTER — Ambulatory Visit: Payer: Self-pay

## 2015-09-29 NOTE — Lactation Note (Signed)
This note was copied from the chart of [place]. Lactation Consultation Note  Mom states she is very anxious and nervous about her milk supply.  She states when her milk came to volume she was pumping 90 mls but now 45-61mls.   Reports using hand expression each time. She said she is mostly pumping every 2-3 hours and power pumping at times.  She feels bad they she didn't wake up to pump at night a few times.  Discussed possibility of supply increasing as baby goes to breast more.  Written information given on herbal supplements.  Mom appears extremely anxious and stressed.  Stressed importance of relaxation and enjoying her baby.  Encouraged much skin to skin tonight.  LC to follow up prior to discharge.  Patient Name: Wanda Moreno [id number] Date: 09/29/2015     Maternal Data    Feeding    LATCH Score/Interventions                      Lactation Tools Discussed/Used     Consult Status      Ave Filter 09/29/2015, 4:28 PM

## 2015-09-30 ENCOUNTER — Ambulatory Visit: Payer: Self-pay

## 2015-09-30 NOTE — Lactation Note (Signed)
This note was copied from the chart of [name]. Lactation Consultation Note  Visit with mom in the rooming in room.  Here to assist with latching baby but just finished a bottle and sleepy.  Mom encouraged to page when baby starts cueing.  Patient Name: Wanda Moreno [id number] Date: 09/30/2015     Maternal Data    Feeding    LATCH Score/Interventions                      Lactation Tools Discussed/Used     Consult Status      Ave Filter 09/30/2015, 12:36 PM

## 2015-10-12 ENCOUNTER — Ambulatory Visit (INDEPENDENT_AMBULATORY_CARE_PROVIDER_SITE_OTHER): Payer: Commercial Indemnity | Admitting: Family Medicine

## 2015-10-12 ENCOUNTER — Ambulatory Visit (INDEPENDENT_AMBULATORY_CARE_PROVIDER_SITE_OTHER): Payer: Managed Care, Other (non HMO)

## 2015-10-12 VITALS — BP 118/66 | HR 103 | Temp 98.8°F | Resp 18 | Ht 64.0 in | Wt 167.8 lb

## 2015-10-12 DIAGNOSIS — J111 Influenza due to unidentified influenza virus with other respiratory manifestations: Secondary | ICD-10-CM

## 2015-10-12 DIAGNOSIS — M791 Myalgia, unspecified site: Secondary | ICD-10-CM

## 2015-10-12 DIAGNOSIS — R05 Cough: Secondary | ICD-10-CM

## 2015-10-12 DIAGNOSIS — R69 Illness, unspecified: Secondary | ICD-10-CM

## 2015-10-12 DIAGNOSIS — R509 Fever, unspecified: Secondary | ICD-10-CM

## 2015-10-12 LAB — POCT CBC
GRANULOCYTE PERCENT: 74.7 % (ref 37–80)
HCT, POC: 35.2 % — AB (ref 37.7–47.9)
HEMOGLOBIN: 12.2 g/dL (ref 12.2–16.2)
Lymph, poc: 2 (ref 0.6–3.4)
MCH: 29.3 pg (ref 27–31.2)
MCHC: 34.8 g/dL (ref 31.8–35.4)
MCV: 84.2 fL (ref 80–97)
MID (CBC): 0.9 (ref 0–0.9)
MPV: 7.1 fL (ref 0–99.8)
PLATELET COUNT, POC: 223 10*3/uL (ref 142–424)
POC Granulocyte: 8.3 — AB (ref 2–6.9)
POC LYMPH PERCENT: 17.6 %L (ref 10–50)
POC MID %: 7.7 %M (ref 0–12)
RBC: 4.17 M/uL (ref 4.04–5.48)
RDW, POC: 12.2 %
WBC: 11.1 10*3/uL — AB (ref 4.6–10.2)

## 2015-10-12 LAB — POCT URINALYSIS DIP (MANUAL ENTRY)
BILIRUBIN UA: NEGATIVE
GLUCOSE UA: NEGATIVE
LEUKOCYTES UA: NEGATIVE
NITRITE UA: NEGATIVE
Protein Ur, POC: NEGATIVE
Spec Grav, UA: 1.025
Urobilinogen, UA: 0.2
pH, UA: 5.5

## 2015-10-12 LAB — POCT INFLUENZA A/B
INFLUENZA B, POC: NEGATIVE
Influenza A, POC: NEGATIVE

## 2015-10-12 MED ORDER — OSELTAMIVIR PHOSPHATE 75 MG PO CAPS: 75.0000 mg | ORAL_CAPSULE | Freq: Two times a day (BID) | ORAL | Status: DC

## 2015-10-12 NOTE — Progress Notes (Signed)
10/12/2015 4:51 PM   DOB: 01/08/1976 / MRN: [id number]  SUBJECTIVE:  Wanda Moreno is a 39 y.o. female presenting for low grade fever, chills, myalgia, and headache.  She complains of mild cough.  She had the flu shot.  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    She is allergic to adhesive.   She  has a past medical history of Vaginal Pap smear, abnormal; Headache; GERD (gastroesophageal reflux disease); and Chronic kidney disease.    She  reports that she quit smoking about 9 months ago. She has never used smokeless tobacco. She reports that she drinks alcohol. She reports that she does not use illicit drugs. She  reports that she currently engages in sexual activity. The patient  has past surgical history that includes Wisdom tooth extraction and Hernia repair.  Her family history includes Hypertension in her mother.  Review of Systems  Constitutional: Positive for fever.  Eyes: Negative for blurred vision.  Respiratory: Positive for cough. Negative for sputum production, shortness of breath and wheezing.   Cardiovascular: Negative for chest pain.  Gastrointestinal: Negative for nausea.  Genitourinary: Negative for dysuria, urgency and frequency.  Musculoskeletal: Negative for myalgias.  Skin: Negative for rash.  Neurological: Negative for dizziness and headaches.    Problem list and medications reviewed and updated by myself where necessary, and exist elsewhere in the encounter.   OBJECTIVE:  BP 118/66 mmHg  Pulse 103  Temp(Src) 98.8 F (37.1 C) (Oral)  Resp 18  Ht 5\' 4"  (1.626 m)  Wt 167 lb 12.8 oz (76.114 kg)  BMI 28.79 kg/m2  SpO2 94%  Physical Exam  Constitutional: She is oriented to person, place, and time. She appears well-developed.  Eyes: EOM are normal. Pupils are equal, round, and reactive to light.  Cardiovascular: Regular rhythm and normal heart sounds.     Pulmonary/Chest: Effort normal and breath sounds normal.  Abdominal: She exhibits no distension.  Musculoskeletal: Normal range of motion.  Neurological: She is alert and oriented to person, place, and time. No cranial nerve deficit.  Skin: Skin is warm and dry. She is not diaphoretic.  Psychiatric: She has a normal mood and affect.  Vitals reviewed.   Results for orders placed or performed in visit on 10/12/15 (from the past 48 hour(s))  POCT CBC     Status: Abnormal   Collection Time: 10/12/15  3:56 PM  Result Value Ref Range   WBC 11.1 (A) 4.6 - 10.2 K/uL   Lymph, poc 2.0 0.6 - 3.4   POC LYMPH PERCENT 17.6 10 - 50 %L   MID (cbc) 0.9 0 - 0.9   POC MID % 7.7 0 - 12 %M   POC Granulocyte 8.3 (A) 2 - 6.9   Granulocyte percent 74.7 37 - 80 %G   RBC 4.17 4.04 - 5.48 M/uL   Hemoglobin 12.2 12.2 - 16.2 g/dL   HCT, POC 35.2 (A) 37.7 - 47.9 %   MCV 84.2 80 - 97 fL   MCH, POC 29.3 27 - 31.2 pg   MCHC 34.8 31.8 - 35.4 g/dL   RDW, POC 12.2 %   Platelet Count, POC 223 142 - 424 K/uL   MPV 7.1 0 - 99.8 fL  POCT Influenza A/B     Status: None   Collection Time: 10/12/15  3:56 PM  Result Value Ref Range   Influenza A, POC Negative Negative  Influenza B, POC Negative Negative  POCT urinalysis dipstick     Status: Abnormal   Collection Time: 10/12/15  4:39 PM  Result Value Ref Range   Color, UA yellow yellow   Clarity, UA clear clear   Glucose, UA negative negative   Bilirubin, UA negative negative   Ketones, POC UA small (15) (A) negative   Spec Grav, UA 1.025    Blood, UA small (A) negative   pH, UA 5.5    Protein Ur, POC negative negative   Urobilinogen, UA 0.2    Nitrite, UA Negative Negative   Leukocytes, UA Negative Negative     ASSESSMENT AND PLAN  Wanda Moreno was seen today for headache, chills, fever, generalized body aches and follow-up.  Diagnoses and all orders for this visit:  Myalgia -     POCT CBC -     POCT Influenza A/B -     POCT urinalysis dipstick -      DG Chest 2 View; Future  Influenza-like illness -     oseltamivir (TAMIFLU) 75 MG capsule; Take 1 capsule (75 mg total) by mouth 2 (two) times daily.    The patient was advised to call or return to clinic if she does not see an improvement in symptoms or to seek the care of the closest emergency department if she worsens with the above plan.   [name], MHS, PA-C [facility] 10/12/2015 4:51 PM

## 2015-10-13 ENCOUNTER — Telehealth: Payer: Self-pay

## 2015-10-13 NOTE — Telephone Encounter (Signed)
ASSESSMENT AND PLAN  Wanda Moreno was seen today for nasal congestion, sore throat and nausea.  Diagnoses and all orders for this visit:  Sore throat: Patient with a new premature son. He has been screened adequately and this is likely the common cold. Advised that he stay away from mom and baby until he is feeling better.  - POCT rapid strep A - Culture, Group A Strep  Nasal congestion - POCT Influenza A/B  Need for prophylactic measure - Tdap vaccine greater than or equal to 7yo IM - Flu Vaccine QUAD 36+ mos IM    The patient was advised to call or return to clinic if he does not see an improvement in symptoms or to seek the care of the closest emergency department if he worsens with the above plan.   Wanda Moreno, MHS, PA-C [facility] 09/11/2015 7:07 PM           He was not given Tamiflu

## 2015-10-13 NOTE — Telephone Encounter (Signed)
Pt was seen recently and given therma flu-and she was wondering if it could be called in for her husband, her fever has spiked and they have an infant at home and cant afford for both to be sick

## 2015-10-13 NOTE — Telephone Encounter (Signed)
Patient is calling to check the status of her previous request to get Tamiflu sent to the pharmacy for her husband. The patient states that they have a premature newborn at home and she does not want to take any chances of them spreading the flu. She states that if we are able to call the prescription in this evening, send it to [facility]. If it is sent in tomorrow it should go to [facility] on [address]. In [place]. Please advise.   [phone] (M)

## 2015-10-14 NOTE — Progress Notes (Signed)
Patient ID: Wanda Moreno, female   DOB: 18-Nov-1975, 40 y.o.   MRN: [id number] Pt assessed, reviewed xray, labs, documentation and agree w/ assessment and plan. Delman Cheadle, MD MPH

## 2016-03-09 ENCOUNTER — Encounter: Payer: Self-pay | Admitting: Family Medicine

## 2016-07-01 ENCOUNTER — Other Ambulatory Visit: Payer: Self-pay | Admitting: Obstetrics and Gynecology

## 2016-07-02 LAB — CYTOLOGY - PAP

## 2016-07-19 LAB — OB RESULTS CONSOLE ABO/RH: RH Type: POSITIVE

## 2016-07-19 LAB — OB RESULTS CONSOLE RUBELLA ANTIBODY, IGM: Rubella: UNDETERMINED

## 2016-07-19 LAB — OB RESULTS CONSOLE GBS: GBS: POSITIVE

## 2016-07-19 LAB — OB RESULTS CONSOLE HIV ANTIBODY (ROUTINE TESTING): HIV: NONREACTIVE

## 2016-07-19 LAB — OB RESULTS CONSOLE HEPATITIS B SURFACE ANTIGEN: Hepatitis B Surface Ag: NEGATIVE

## 2016-07-19 LAB — OB RESULTS CONSOLE RPR: RPR: NONREACTIVE

## 2016-07-19 LAB — OB RESULTS CONSOLE ANTIBODY SCREEN: ANTIBODY SCREEN: NEGATIVE

## 2016-08-11 ENCOUNTER — Other Ambulatory Visit (HOSPITAL_COMMUNITY): Payer: Self-pay | Admitting: Obstetrics and Gynecology

## 2016-08-11 DIAGNOSIS — Z3689 Encounter for other specified antenatal screening: Secondary | ICD-10-CM

## 2016-08-11 DIAGNOSIS — O09299 Supervision of pregnancy with other poor reproductive or obstetric history, unspecified trimester: Secondary | ICD-10-CM

## 2016-08-11 DIAGNOSIS — O09522 Supervision of elderly multigravida, second trimester: Secondary | ICD-10-CM

## 2016-09-06 ENCOUNTER — Encounter (HOSPITAL_COMMUNITY): Payer: Self-pay | Admitting: Obstetrics and Gynecology

## 2016-09-10 ENCOUNTER — Ambulatory Visit (INDEPENDENT_AMBULATORY_CARE_PROVIDER_SITE_OTHER): Payer: Managed Care, Other (non HMO) | Admitting: Physician Assistant

## 2016-09-10 ENCOUNTER — Encounter (HOSPITAL_COMMUNITY): Payer: Managed Care, Other (non HMO)

## 2016-09-10 ENCOUNTER — Ambulatory Visit (HOSPITAL_COMMUNITY): Payer: Managed Care, Other (non HMO)

## 2016-09-10 VITALS — BP 98/58 | HR 92 | Temp 98.5°F | Resp 18 | Ht 64.5 in | Wt 169.0 lb

## 2016-09-10 DIAGNOSIS — J988 Other specified respiratory disorders: Secondary | ICD-10-CM

## 2016-09-10 DIAGNOSIS — B9789 Other viral agents as the cause of diseases classified elsewhere: Secondary | ICD-10-CM

## 2016-09-10 DIAGNOSIS — R070 Pain in throat: Secondary | ICD-10-CM

## 2016-09-10 LAB — POCT RAPID STREP A (OFFICE): RAPID STREP A SCREEN: NEGATIVE

## 2016-09-10 NOTE — Progress Notes (Signed)
[facility] [address] [place] [address] [phone]  Date:  09/10/2016   Name:  Wanda Moreno   DOB:  21-Jun-1976   MRN:  [id number]  PCP:  No PCP Per Patient   Chief Complaint  Patient presents with  . Sore Throat    describes as dry causing cough  . Headache   History of Present Illness:  Wanda Moreno is a 40 y.o. female patient who presents to [facility] for cc of sore throat and headache.  --he has not had the flu --symptoms are about 2 days.  Throat is dry.  She has headache.  Nasal congestion that is mild.  No wet cough.  No dizziness.  No ear pain.  She has been taking tylenol and cough drop.  The last time she had an antibiotic.      There are no active problems to display for this patient.   Past Medical History:  Diagnosis Date  . Chronic kidney disease    kidney stones at age 61  . GERD (gastroesophageal reflux disease)   . Headache   . Vaginal Pap smear, abnormal     Past Surgical History:  Procedure Laterality Date  . HERNIA REPAIR     As a child  . WISDOM TOOTH EXTRACTION      Social History  Substance Use Topics  . Smoking status: Former Smoker    Quit date: 01/07/2015  . Smokeless tobacco: Never Used  . Alcohol use 0.0 oz/week     Comment: occ, but not while preg    Family History  Problem Relation Age of Onset  . Hypertension Mother     Allergies  Allergen Reactions  . Adhesive [Tape]     Medication list has been reviewed and updated.  Current Outpatient Prescriptions on File Prior to Visit  Medication Sig Dispense Refill  . calcium carbonate (TUMS - DOSED IN MG ELEMENTAL CALCIUM) 500 MG chewable tablet Chew 2 tablets by mouth 2 (two) times daily as needed for indigestion or heartburn. Reported on 10/12/2015    . Prenatal Vit-Fe Fumarate-FA (PRENATAL MULTIVITAMIN) TABS tablet Take 1 tablet by mouth at bedtime.    . ranitidine (ZANTAC) 150 MG tablet Take 150 mg by mouth daily as needed for heartburn.  Reported on 10/12/2015    . Ascorbic Acid (VITAMIN C) 100 MG tablet Take 100 mg by mouth daily.    . IRON PO Take by mouth.    . mupirocin ointment (BACTROBAN) 2 % Place 1 application into the nose 2 (two) times daily.     No current facility-administered medications on file prior to visit.     ROS rOS otherwise unremarkable unless listed above  Physical Examination: BP (!) 98/58   Pulse 92   Temp 98.5 F (36.9 C) (Oral)   Resp 18   Ht 5' 4.5" (1.638 m)   Wt 169 lb (76.7 kg)   LMP 05/01/2016   SpO2 100%   BMI 28.56 kg/m  Ideal Body Weight: Weight in (lb) to have BMI = 25: 147.6  Physical Exam  Constitutional: She is oriented to person, place, and time. She appears well-developed and well-nourished. No distress.  HENT:  Head: Normocephalic and atraumatic.  Right Ear: Tympanic membrane, external ear and ear canal normal.  Left Ear: Tympanic membrane, external ear and ear canal normal.  Nose: Mucosal edema and rhinorrhea present. Right sinus exhibits no maxillary sinus tenderness and no frontal sinus tenderness. Left sinus exhibits no  maxillary sinus tenderness and no frontal sinus tenderness.  Mouth/Throat: No uvula swelling. No oropharyngeal exudate, posterior oropharyngeal edema or posterior oropharyngeal erythema.  Eyes: Conjunctivae and EOM are normal. Pupils are equal, round, and reactive to light.  Cardiovascular: Normal rate and regular rhythm.  Exam reveals no gallop, no distant heart sounds and no friction rub.   No murmur heard. Pulmonary/Chest: Effort normal. No respiratory distress. She has no decreased breath sounds. She has no wheezes. She has no rhonchi.  Lymphadenopathy:       Head (right side): No submandibular, no tonsillar, no preauricular and no posterior auricular adenopathy present.       Head (left side): No submandibular, no tonsillar, no preauricular and no posterior auricular adenopathy present.  Neurological: She is alert and oriented to person, place,  and time.  Skin: She is not diaphoretic.  Psychiatric: She has a normal mood and affect. Her behavior is normal.   Results for orders placed or performed in visit on 09/10/16  Culture, Group A Strep  Result Value Ref Range   Strep A Culture Negative   POCT rapid strep A  Result Value Ref Range   Rapid Strep A Screen Negative Negative      Assessment and Plan: Wanda Moreno is a 40 y.o. female who is here today for cc of sore throat and ha. Treat supportively Viral respiratory illness  Throat pain - Plan: POCT rapid strep A, Culture, Group A Strep  Ivar Drape, PA-C [facility] 09/10/2016 5:09 PM

## 2016-09-10 NOTE — Patient Instructions (Addendum)
Please use the nasal rinse and you can also use tylenol for pain.    Upper Respiratory Infection, Adult Most upper respiratory infections (URIs) are caused by a virus. A URI affects the nose, throat, and upper air passages. The most common type of URI is often called "the common cold." Follow these instructions at home:  Take medicines only as told by your doctor.  Gargle warm saltwater or take cough drops to comfort your throat as told by your doctor.  Use a warm mist humidifier or inhale steam from a shower to increase air moisture. This may make it easier to breathe.  Drink enough fluid to keep your pee (urine) clear or pale yellow.  Eat soups and other clear broths.  Have a healthy diet.  Rest as needed.  Go back to work when your fever is gone or your doctor says it is okay.  You may need to stay home longer to avoid giving your URI to others.  You can also wear a face mask and wash your hands often to prevent spread of the virus.  Use your inhaler more if you have asthma.  Do not use any tobacco products, including cigarettes, chewing tobacco, or electronic cigarettes. If you need help quitting, ask your doctor. Contact a doctor if:  You are getting worse, not better.  Your symptoms are not helped by medicine.  You have chills.  You are getting more short of breath.  You have brown or red mucus.  You have yellow or brown discharge from your nose.  You have pain in your face, especially when you bend forward.  You have a fever.  You have puffy (swollen) neck glands.  You have pain while swallowing.  You have white areas in the back of your throat. Get help right away if:  You have very bad or constant:  Headache.  Ear pain.  Pain in your forehead, behind your eyes, and over your cheekbones (sinus pain).  Chest pain.  You have long-lasting (chronic) lung disease and any of the following:  Wheezing.  Long-lasting cough.  Coughing up blood.  A  change in your usual mucus.  You have a stiff neck.  You have changes in your:  Vision.  Hearing.  Thinking.  Mood. This information is not intended to replace advice given to you by your health care provider. Make sure you discuss any questions you have with your health care provider. Document Released: 03/01/2008 Document Revised: 05/16/2016 Document Reviewed: 12/19/2013 Elsevier Interactive Patient Education  2017 [employer].   IF you received an x-ray today, you will receive an invoice from [facility]. Please contact [facility] at [phone] with questions or concerns regarding your invoice.   IF you received labwork today, you will receive an invoice from [facility]. Please contact [facility] at [phone] with questions or concerns regarding your invoice.   Our billing staff will not be able to assist you with questions regarding bills from these companies.  You will be contacted with the lab results as soon as they are available. The fastest way to get your results is to activate your My Chart account. Instructions are located on the last page of this paperwork. If you have not heard from [place] regarding the results in 2 weeks, please contact this office.

## 2016-09-14 ENCOUNTER — Ambulatory Visit (HOSPITAL_COMMUNITY)
Admission: RE | Admit: 2016-09-14 | Discharge: 2016-09-14 | Disposition: A | Discharge status: Home / Self Care | Payer: Managed Care, Other (non HMO) | Source: Ambulatory Visit | Attending: Obstetrics and Gynecology | Admitting: Obstetrics and Gynecology

## 2016-09-14 ENCOUNTER — Encounter (HOSPITAL_COMMUNITY): Payer: Self-pay

## 2016-09-14 ENCOUNTER — Other Ambulatory Visit (HOSPITAL_COMMUNITY): Payer: Self-pay | Admitting: Obstetrics and Gynecology

## 2016-09-14 DIAGNOSIS — O09299 Supervision of pregnancy with other poor reproductive or obstetric history, unspecified trimester: Secondary | ICD-10-CM

## 2016-09-14 DIAGNOSIS — O09522 Supervision of elderly multigravida, second trimester: Secondary | ICD-10-CM | POA: Diagnosis not present

## 2016-09-14 DIAGNOSIS — Z363 Encounter for antenatal screening for malformations: Secondary | ICD-10-CM | POA: Insufficient documentation

## 2016-09-14 DIAGNOSIS — O09292 Supervision of pregnancy with other poor reproductive or obstetric history, second trimester: Secondary | ICD-10-CM | POA: Diagnosis not present

## 2016-09-14 DIAGNOSIS — Z3A19 19 weeks gestation of pregnancy: Secondary | ICD-10-CM | POA: Diagnosis not present

## 2016-09-14 DIAGNOSIS — O09212 Supervision of pregnancy with history of pre-term labor, second trimester: Secondary | ICD-10-CM | POA: Insufficient documentation

## 2016-09-14 DIAGNOSIS — Z3689 Encounter for other specified antenatal screening: Secondary | ICD-10-CM

## 2016-09-14 DIAGNOSIS — O352XX0 Maternal care for (suspected) hereditary disease in fetus, not applicable or unspecified: Secondary | ICD-10-CM

## 2016-09-14 LAB — CULTURE, GROUP A STREP: Strep A Culture: NEGATIVE

## 2016-09-14 NOTE — Progress Notes (Signed)
Genetic Counseling  High-Risk Gestation Note  Appointment Date:  09/14/2016 Referred By: Wanda Millers, MD Date of Birth:  November 21, 1975 Partner:  Wanda Moreno   Pregnancy [id number] Estimated Date of Delivery: 02/05/17 Estimated Gestational Age: [redacted]w[redacted]d Attending: Griffin Dakin, MD   Wanda Moreno was seen for genetic counseling because of a maternal age of 40 y.o. and a previous child with congenital heart disease     In summary:  Discussed increased risk for fetal aneuploidy with advanced maternal age  Reviewed results of patient's NIPS (Panorama) and ultrasound  Discussed limitations of screening and option of diagnostic testing  Declined amniocentesis  Reviewed family history concerns  Couple's son with AVSD, apparently isolated  Reviewed recurrence risk approximately 2.5%-3% for Moreno pregnancy in the case of multifactorial inheritance  Recurrence risk estimate may change in the case of a different underlying etiology  Discussed screening options of detailed ultrasound (performed today) and fetal echocardiogram scheduled 09/30/16 with Wanda Moreno  She was counseled regarding maternal age and the association with risk for chromosome conditions due to nondisjunction with aging of the ova.   We reviewed chromosomes, nondisjunction, and the associated 1 in 31 risk for fetal aneuploidy related to a maternal age of 40 y.o. at [redacted]w[redacted]d gestation.  She was counseled that the risk for aneuploidy decreases as gestational age increases, accounting for those pregnancies which spontaneously abort.  We specifically discussed Down syndrome (trisomy 14), trisomies 22 and 5, and sex chromosome aneuploidies (47,XXX and 47,XXY) including the common features and prognoses of each.   We also reviewed the results of Wanda Moreno' non-invasive prenatal screening (NIPS).  We discussed that NIPS analyzes placental cell free DNA in maternal circulation to evaluate for the  presence of extra chromosome conditions.  Thus, it is able to provide risk assessment for specific chromosome conditions, but is not diagnostic.  Specifically, Wanda Moreno had Panorama through [facility].  Based on this test, the chance for her baby to have aneuploidy for chromosomes 13, 18, 21, or X was reduced to less than 1 in 10000.  She was counseled that approximately 50-80% of fetuses with Down syndrome and up to 90% of fetuses with trisomies 54 and 18, when well visualized, have detectable anomalies or soft markers by ultrasound.   We also discussed the availability of diagnostic testing by way of amniocentesis.  We reviewed the risks, benefits and limitations of amniocentesis including the approximate 1 in 300-500 risk for pregnancy complications following amniocentesis. We discussed the possible results that the tests might provide including: positive, negative, unanticipated, and no result. Finally, she was counseled regarding the cost of each option and potential out of pocket expenses. After reviewing the above results and the available options, Wanda Moreno expressed that she is not interested in pursuing diagnostic testing at this time or in the future, given the associated risk of complications.    Detailed ultrasound was performed at the time of today's visit. Visualized fetal anatomy was within normal range. Complete ultrasound results under separate cover. She understands that ultrasound and NIPS cannot rule out all birth defects or genetic syndromes.    Both family histories were reviewed and found to be contributory for congenital heart defect for the couple's son, Wanda Moreno. He was diagnosed postnatally with ASD and VSD. He had mitral valve and tricuspid valve repair at Wanda Moreno. He is currently followed by Wanda Moreno. The patient reported that he is currently 28 months old and otherwise healthy. No  specific etiology has been determined for his heart  defect. Congenital heart defects occur in approximately 1% of pregnancies.  Congenital heart defects may occur due to multifactorial influences, chromosomal abnormalities, genetic syndromes or environmental exposures.  Isolated heart defects are generally multifactorial.  Given the reported family history and assuming multifactorial inheritance, the risk for a congenital heart defect in the Moreno pregnancy would be approximately 2.5%-3%. We reviewed the prenatal screening options of detailed ultrasound and fetal echocardiogram. Fetal echocardiogram is scheduled for 09/30/16 with Wanda Moreno.   The patient also reported her two nieces (her brother's daughters) had ocular colobomas. The younger of the two nieces has blindness in one eye. She also had congenital hip dysplasia and one of her outer ears was described to have a smooth appearance. The older niece and mother of the two nieces reportedly have mild colobomas with no effects on their vision. Coloboma is an ocular birth defects resulting from abnormal development of the eye during embryogenesis and can be present in any ocular tissue. This can be isolated or part of an underlying genetic syndrome. Autosomal dominant inheritance has been reported previously in some families. We discussed that the reported family history was suggestive of autosomal dominant inheritance, which would not likely increase recurrence risk for the Moreno pregnancy, given that the mother of the affected daughters is not related to the patient. However, we discussed that additional information regarding this family history and the specific etiology may alter recurrence risk assessment.   Ms. Moreno also reported a female paternal first cousin once removed with mild intellectual disability. He lives independently but relatives live close by. He is unable to drive. He is otherwise healthy. No etiology is known but this relative's brother and sister and their father were  described to be a little mentally slower compared to other relatives. No dysmorphic features were described for these individuals. Mrs. Liriano was counseled that there are many different causes of intellectual disabilities including environmental, multifactorial, and genetic etiologies.  We discussed that a specific diagnosis for intellectual disability can be determined in approximately 50% of these individuals.  In the remaining 50% of individuals, a diagnosis may never be determined.  Regarding genetic causes, we discussed that chromosome aberrations (aneuploidy, deletions, duplications, insertions, and translocations) are responsible for a small percentage of individuals with intellectual disability.  Many individuals with chromosome aberrations have additional differences, including congenital anomalies or minor dysmorphisms.  Likewise, single gene conditions are the underlying cause of intellectual delay in some families.  We discussed that many gene conditions have intellectual disability as a feature, but also often include other physical or medical differences.  We discussed that the reported family history was not suggestive of a particular genetic syndrome for these relatives. Given the degree of relation and remaining reported family history, recurrence risk for the patient's offspring is likely low. However, we discussed that without more specific information, it is difficult to provide an accurate risk assessment.  Further genetic counseling is warranted if more information is obtained.  Wanda Moreno denied exposure to environmental toxins or chemical agents. She denied the use of tobacco or street drugs. She reported drinking alcohol prior to being aware of the pregnancy but reported no alcohol exposure since the beginning of September. The all-or-none period was discussed, meaning exposures that occur in the first 4 weeks of gestation are typically thought to either not affect the  pregnancy at all or result in a miscarriage.  Prenatal alcohol exposure can increase  the risk for growth delays, small head size, heart defects, eye and facial differences, as well as behavior problems and learning disabilities. The risk of these to occur tends to increase with the amount of alcohol consumed. However, because there is no identified safe amount of alcohol in pregnancy, it is recommended to completely avoid alcohol in pregnancy. Given the reported timing of exposure, risk for associated effects are not likely increased for the Moreno pregnancy.   She reported taking Prozac during the pregnancy. Prozac use during pregnancy does not appear to increase the risk for birth defects.  Using Prozac late in pregnancy can increase the chance for a mild transient neonatal syndrome of central nervous system including irritability, vomiting, jitteriness and convulsions, as well as neonatal pulmonary hypertension.  These associated symptoms typically do not appear to occur frequently or severely. We discussed that she should not alter her medication use without first consulting with her physician. She denied significant viral illnesses during the course of her pregnancy. Her medical and surgical histories were noncontributory.   I counseled Wanda Moreno regarding the above risks and available options.  The approximate face-to-face time with the genetic counselor was 40 minutes.  Chipper Oman, MS,  Certified Genetic Counselor 09/14/2016

## 2016-09-15 ENCOUNTER — Other Ambulatory Visit (HOSPITAL_COMMUNITY): Payer: Self-pay | Admitting: *Deleted

## 2016-09-15 DIAGNOSIS — Z0489 Encounter for examination and observation for other specified reasons: Secondary | ICD-10-CM

## 2016-09-15 DIAGNOSIS — IMO0002 Reserved for concepts with insufficient information to code with codable children: Secondary | ICD-10-CM

## 2016-09-16 DIAGNOSIS — Z3A19 19 weeks gestation of pregnancy: Secondary | ICD-10-CM | POA: Insufficient documentation

## 2016-09-27 NOTE — L&D Delivery Note (Signed)
Pt was admitted this am for an induction. She had an amniotomy with clear fluid . She progressed rapidly to 8cm then had a protracted labor curve. She had an IUPC placed. She completed the first stage without difficulty. She pushed for 10 min and had a SVD of one live viable white female in the ROA position. Placenta-S/I. EBL-400cc. No tears.

## 2016-10-01 ENCOUNTER — Other Ambulatory Visit (HOSPITAL_COMMUNITY): Payer: Self-pay

## 2016-10-12 ENCOUNTER — Ambulatory Visit (HOSPITAL_COMMUNITY)
Admission: RE | Admit: 2016-10-12 | Discharge: 2016-10-12 | Disposition: A | Discharge status: Home / Self Care | Payer: Managed Care, Other (non HMO) | Source: Ambulatory Visit | Attending: Obstetrics and Gynecology | Admitting: Obstetrics and Gynecology

## 2016-10-12 ENCOUNTER — Encounter (HOSPITAL_COMMUNITY): Payer: Self-pay

## 2017-01-26 ENCOUNTER — Other Ambulatory Visit: Payer: Self-pay | Admitting: Obstetrics and Gynecology

## 2017-01-27 ENCOUNTER — Telehealth (HOSPITAL_COMMUNITY): Payer: Self-pay | Admitting: *Deleted

## 2017-01-27 NOTE — Telephone Encounter (Signed)
Preadmission screen  

## 2017-01-31 ENCOUNTER — Inpatient Hospital Stay (HOSPITAL_COMMUNITY)
Admission: RE | Admit: 2017-01-31 | Discharge: 2017-02-01 | DRG: 775 | Disposition: A | Discharge status: Home / Self Care | Payer: Managed Care, Other (non HMO) | Source: Ambulatory Visit | Attending: Obstetrics and Gynecology | Admitting: Obstetrics and Gynecology

## 2017-01-31 ENCOUNTER — Encounter (HOSPITAL_COMMUNITY): Payer: Self-pay

## 2017-01-31 ENCOUNTER — Inpatient Hospital Stay (HOSPITAL_COMMUNITY): Payer: Managed Care, Other (non HMO) | Admitting: Anesthesiology

## 2017-01-31 DIAGNOSIS — Z3A39 39 weeks gestation of pregnancy: Secondary | ICD-10-CM

## 2017-01-31 DIAGNOSIS — Z3493 Encounter for supervision of normal pregnancy, unspecified, third trimester: Secondary | ICD-10-CM | POA: Diagnosis present

## 2017-01-31 DIAGNOSIS — Z3490 Encounter for supervision of normal pregnancy, unspecified, unspecified trimester: Secondary | ICD-10-CM

## 2017-01-31 DIAGNOSIS — O352XX0 Maternal care for (suspected) hereditary disease in fetus, not applicable or unspecified: Secondary | ICD-10-CM

## 2017-01-31 LAB — CBC
HCT: 31.1 % — ABNORMAL LOW (ref 36.0–46.0)
Hemoglobin: 10.5 g/dL — ABNORMAL LOW (ref 12.0–15.0)
MCH: 28.3 pg (ref 26.0–34.0)
MCHC: 33.8 g/dL (ref 30.0–36.0)
MCV: 83.8 fL (ref 78.0–100.0)
Platelets: 230 10*3/uL (ref 150–400)
RBC: 3.71 MIL/uL — ABNORMAL LOW (ref 3.87–5.11)
RDW: 14.1 % (ref 11.5–15.5)
WBC: 9.6 10*3/uL (ref 4.0–10.5)

## 2017-01-31 LAB — TYPE AND SCREEN
ABO/RH(D): O POS
Antibody Screen: NEGATIVE

## 2017-01-31 MED ORDER — ONDANSETRON HCL 4 MG/2ML IJ SOLN
4.0000 mg | Freq: Four times a day (QID) | INTRAMUSCULAR | Status: DC | PRN
  Filled 2017-01-31: qty 2

## 2017-01-31 MED ORDER — EPHEDRINE 5 MG/ML INJ
10.0000 mg | INTRAVENOUS | Status: DC | PRN
Start: 2017-01-31 — End: 2017-01-31
  Filled 2017-01-31: qty 2

## 2017-01-31 MED ORDER — COCONUT OIL OIL: 1.0000 "application " | TOPICAL_OIL | Status: DC | PRN

## 2017-01-31 MED ORDER — TETANUS-DIPHTH-ACELL PERTUSSIS 5-2.5-18.5 LF-MCG/0.5 IM SUSP: 0.5000 mL | Freq: Once | INTRAMUSCULAR | Status: DC

## 2017-01-31 MED ORDER — LACTATED RINGERS IV SOLN
INTRAVENOUS | Status: DC
  Administered 2017-01-31: 09:00:00 via INTRAVENOUS

## 2017-01-31 MED ORDER — ACETAMINOPHEN 325 MG PO TABS
650.0000 mg | ORAL_TABLET | ORAL | Status: DC | PRN
  Administered 2017-01-31: 650 mg via ORAL
  Filled 2017-01-31: qty 2

## 2017-01-31 MED ORDER — PHENYLEPHRINE 40 MCG/ML (10ML) SYRINGE FOR IV PUSH (FOR BLOOD PRESSURE SUPPORT)
80.0000 ug | PREFILLED_SYRINGE | INTRAVENOUS | Status: DC | PRN
  Filled 2017-01-31: qty 5

## 2017-01-31 MED ORDER — DIPHENHYDRAMINE HCL 50 MG/ML IJ SOLN: 12.5000 mg | INTRAMUSCULAR | Status: DC | PRN

## 2017-01-31 MED ORDER — OXYCODONE-ACETAMINOPHEN 5-325 MG PO TABS: 1.0000 | ORAL_TABLET | ORAL | Status: DC | PRN

## 2017-01-31 MED ORDER — LIDOCAINE HCL (PF) 1 % IJ SOLN
30.0000 mL | INTRAMUSCULAR | Status: DC | PRN
  Filled 2017-01-31: qty 30

## 2017-01-31 MED ORDER — SOD CITRATE-CITRIC ACID 500-334 MG/5ML PO SOLN
30.0000 mL | ORAL | Status: DC | PRN
  Administered 2017-01-31: 30 mL via ORAL
  Filled 2017-01-31: qty 15

## 2017-01-31 MED ORDER — OXYTOCIN BOLUS FROM INFUSION
500.0000 mL | Freq: Once | INTRAVENOUS | Status: AC
  Administered 2017-01-31: 500 mL via INTRAVENOUS

## 2017-01-31 MED ORDER — SENNOSIDES-DOCUSATE SODIUM 8.6-50 MG PO TABS: 2.0000 | ORAL_TABLET | ORAL | Status: DC

## 2017-01-31 MED ORDER — ONDANSETRON HCL 4 MG PO TABS: 4.0000 mg | ORAL_TABLET | ORAL | Status: DC | PRN

## 2017-01-31 MED ORDER — ZOLPIDEM TARTRATE 5 MG PO TABS
5.0000 mg | ORAL_TABLET | Freq: Every evening | ORAL | Status: DC | PRN
Start: 2017-01-31 — End: 2017-02-02

## 2017-01-31 MED ORDER — FLEET ENEMA 7-19 GM/118ML RE ENEM: 1.0000 | ENEMA | RECTAL | Status: DC | PRN

## 2017-01-31 MED ORDER — ONDANSETRON HCL 4 MG/2ML IJ SOLN: 4.0000 mg | INTRAMUSCULAR | Status: DC | PRN

## 2017-01-31 MED ORDER — EPHEDRINE 5 MG/ML INJ
10.0000 mg | INTRAVENOUS | Status: DC | PRN
  Filled 2017-01-31: qty 2

## 2017-01-31 MED ORDER — LACTATED RINGERS IV SOLN: 500.0000 mL | Freq: Once | INTRAVENOUS | Status: DC

## 2017-01-31 MED ORDER — PHENYLEPHRINE 40 MCG/ML (10ML) SYRINGE FOR IV PUSH (FOR BLOOD PRESSURE SUPPORT)
80.0000 ug | PREFILLED_SYRINGE | INTRAVENOUS | Status: DC | PRN
  Filled 2017-01-31: qty 5
  Filled 2017-01-31: qty 10

## 2017-01-31 MED ORDER — ACETAMINOPHEN 325 MG PO TABS
650.0000 mg | ORAL_TABLET | ORAL | Status: DC | PRN
Start: 2017-01-31 — End: 2017-01-31

## 2017-01-31 MED ORDER — MEASLES, MUMPS & RUBELLA VAC ~~LOC~~ INJ
0.5000 mL | INJECTION | Freq: Once | SUBCUTANEOUS | Status: DC
  Filled 2017-01-31: qty 0.5

## 2017-01-31 MED ORDER — OXYCODONE-ACETAMINOPHEN 5-325 MG PO TABS: 2.0000 | ORAL_TABLET | ORAL | Status: DC | PRN

## 2017-01-31 MED ORDER — IBUPROFEN 600 MG PO TABS
600.0000 mg | ORAL_TABLET | Freq: Four times a day (QID) | ORAL | Status: DC
  Administered 2017-01-31 – 2017-02-01 (×3): 600 mg via ORAL
  Filled 2017-01-31 (×3): qty 1

## 2017-01-31 MED ORDER — PENICILLIN G POTASSIUM 5000000 UNITS IJ SOLR
5.0000 10*6.[IU] | Freq: Once | INTRAMUSCULAR | Status: AC
  Administered 2017-01-31: 5 10*6.[IU] via INTRAVENOUS
  Filled 2017-01-31: qty 5

## 2017-01-31 MED ORDER — WITCH HAZEL-GLYCERIN EX PADS: 1.0000 "application " | MEDICATED_PAD | CUTANEOUS | Status: DC | PRN

## 2017-01-31 MED ORDER — FENTANYL 2.5 MCG/ML BUPIVACAINE 1/10 % EPIDURAL INFUSION (WH - ANES)
14.0000 mL/h | INTRAMUSCULAR | Status: DC | PRN
  Administered 2017-01-31: 14 mL/h via EPIDURAL
  Filled 2017-01-31 (×2): qty 100

## 2017-01-31 MED ORDER — OXYTOCIN 40 UNITS IN LACTATED RINGERS INFUSION - SIMPLE MED
2.5000 [IU]/h | INTRAVENOUS | Status: DC
  Administered 2017-01-31: 2.5 [IU]/h via INTRAVENOUS
  Filled 2017-01-31: qty 1000

## 2017-01-31 MED ORDER — BENZOCAINE-MENTHOL 20-0.5 % EX AERO
1.0000 "application " | INHALATION_SPRAY | CUTANEOUS | Status: DC | PRN
  Filled 2017-01-31: qty 56

## 2017-01-31 MED ORDER — PENICILLIN G POT IN DEXTROSE 60000 UNIT/ML IV SOLN
3.0000 10*6.[IU] | INTRAVENOUS | Status: DC
  Administered 2017-01-31 (×2): 3 10*6.[IU] via INTRAVENOUS
  Filled 2017-01-31 (×5): qty 50

## 2017-01-31 MED ORDER — TERBUTALINE SULFATE 1 MG/ML IJ SOLN
0.2500 mg | Freq: Once | INTRAMUSCULAR | Status: DC | PRN
  Filled 2017-01-31: qty 1

## 2017-01-31 MED ORDER — OXYTOCIN 40 UNITS IN LACTATED RINGERS INFUSION - SIMPLE MED
1.0000 m[IU]/min | INTRAVENOUS | Status: DC
  Administered 2017-01-31: 2 m[IU]/min via INTRAVENOUS

## 2017-01-31 MED ORDER — DIBUCAINE 1 % RE OINT: 1.0000 "application " | TOPICAL_OINTMENT | RECTAL | Status: DC | PRN

## 2017-01-31 MED ORDER — SIMETHICONE 80 MG PO CHEW: 80.0000 mg | CHEWABLE_TABLET | ORAL | Status: DC | PRN

## 2017-01-31 MED ORDER — LACTATED RINGERS IV SOLN: 500.0000 mL | INTRAVENOUS | Status: DC | PRN

## 2017-01-31 NOTE — Progress Notes (Signed)
Dr Ouida Sills called and updated on SVE as well as elevated BP despite pain relief after epidural.  No new orders rec.

## 2017-01-31 NOTE — Anesthesia Pain Management Evaluation Note (Signed)
  CRNA Pain Management Visit Note  Patient: Wanda Moreno, 41 y.o., female  "Hello I am a member of the anesthesia team at [facility]. We have an anesthesia team available at all times to provide care throughout the hospital, including epidural management and anesthesia for C-section. I don't know your plan for the delivery whether it a natural birth, water birth, IV sedation, nitrous supplementation, doula or epidural, but we want to meet your pain goals."   1.Was your pain managed to your expectations on prior hospitalizations?   Yes   2.What is your expectation for pain management during this hospitalization?     Epidural and IV pain meds  3.How can we help you reach that goal? Patient is aware of all pain control options. Questions answered to the best of my ability. MDA called to see patient, as patient wanted to ask more specific questions about epidural. MDA will consult when able.  Record the patient's initial score and the patient's pain goal.   Pain: Patient denying pain.  Pain Goal: Did not obtain a goal number at this point. [facility] wants you to be able to say your pain was always managed very well.  Ronrico Dupin L 01/31/2017

## 2017-01-31 NOTE — Anesthesia Preprocedure Evaluation (Signed)
Anesthesia Evaluation  Patient identified by MRN, date of birth, ID band Patient awake    Reviewed: Allergy & Precautions, H&P , NPO status , Patient's Chart, lab work & pertinent test results  Airway Mallampati: I  TM Distance: >3 FB Neck ROM: full    Dental no notable dental hx.    Pulmonary former smoker,    Pulmonary exam normal breath sounds clear to auscultation       Cardiovascular negative cardio ROS Normal cardiovascular exam     Neuro/Psych negative psych ROS   GI/Hepatic Neg liver ROS, GERD  Medicated and Controlled,  Endo/Other  negative endocrine ROS  Renal/GU negative Renal ROS  negative genitourinary   Musculoskeletal negative musculoskeletal ROS (+)   Abdominal (+) + obese,   Peds  Hematology negative hematology ROS (+)   Anesthesia Other Findings   Reproductive/Obstetrics (+) Pregnancy                             Anesthesia Physical Anesthesia Plan  ASA: II  Anesthesia Plan: Epidural   Post-op Pain Management:    Induction:   Airway Management Planned:   Additional Equipment:   Intra-op Plan:   Post-operative Plan:   Informed Consent: I have reviewed the patients History and Physical, chart, labs and discussed the procedure including the risks, benefits and alternatives for the proposed anesthesia with the patient or authorized representative who has indicated his/her understanding and acceptance.     Plan Discussed with:   Anesthesia Plan Comments:         Anesthesia Quick Evaluation

## 2017-01-31 NOTE — H&P (Addendum)
Pt is a 41 y/o white female, [id number] at 39 weeks who was admitted for induction secondary to [facility]. PNC was uncomplicated. Pt's second child had a congenital heart defect. Fetal echo this preg wnl. She has a h.o. Preterm labor and used Makena this preg PMHX: See hollister, Nl NIPT PNC: see hollister PE: VSSAF         HEENT-wnl         ABD-gravid, no palp ctxs         NST-reactive IMP/ IUP at term         [facility] Plan/ Start Induction

## 2017-02-01 LAB — RPR: RPR: NONREACTIVE

## 2017-02-01 MED ORDER — MEASLES, MUMPS & RUBELLA VAC ~~LOC~~ INJ
0.5000 mL | INJECTION | Freq: Once | SUBCUTANEOUS | Status: AC
Start: 2017-02-01 — End: 2017-02-01
  Administered 2017-02-01: 0.5 mL via SUBCUTANEOUS

## 2017-02-01 MED ORDER — MEASLES, MUMPS & RUBELLA VAC ~~LOC~~ INJ: 0.5000 mL | INJECTION | Freq: Once | SUBCUTANEOUS | Status: DC

## 2017-02-01 MED ORDER — OXYCODONE-ACETAMINOPHEN 5-325 MG PO TABS
1.0000 | ORAL_TABLET | ORAL | Status: DC | PRN
  Administered 2017-02-01 (×2): 1 via ORAL
  Filled 2017-02-01 (×2): qty 1

## 2017-02-01 NOTE — Lactation Note (Signed)
This note was copied from a baby's chart. Lactation Consultation Note  Patient Name: Wanda Moreno [id number] Date: 02/01/2017 Reason for consult: Initial assessment  Baby 90 hours old. Baby and parents resting/dozing. Mom given [facility] brochure with review. Mom to call for assistance with latch when baby cueing to nurse.   Maternal Data    Feeding    LATCH Score/Interventions                      Lactation Tools Discussed/Used     Consult Status Consult Status: Follow-up Date: 02/01/17 Follow-up type: In-patient    Andres Labrum 02/01/2017, 9:46 AM

## 2017-02-01 NOTE — Discharge Summary (Signed)
Obstetric Discharge Summary Reason for Admission: induction of labor Prenatal Procedures: ultrasound Intrapartum Procedures: spontaneous vaginal delivery Postpartum Procedures: none Complications-Operative and Postpartum: none Hemoglobin  Date Value Ref Range Status  01/31/2017 10.5 (L) 12.0 - 15.0 g/dL Final   HCT  Date Value Ref Range Status  01/31/2017 31.1 (L) 36.0 - 46.0 % Final    Physical Exam:  General: alert and cooperative Lochia: appropriate Uterine Fundus: firm DVT Evaluation: No evidence of DVT seen on physical exam.  Discharge Diagnoses: Term Pregnancy-delivered  Discharge Information: Date: 02/01/2017 Activity: pelvic rest Diet: routine Medications: PNV and Ibuprofen Condition: stable Instructions: refer to practice specific booklet Discharge to: home Follow-up Information    Olga Millers, MD Follow up in 4 week(s).   Specialty:  Obstetrics and Gynecology Contact information: [place] [id number] [phone]           Newborn Data: Live born female  Birth Weight: 8 lb 4.1 oz (3745 g) APGAR: 9, 9  Home with mother.  Allyn Kenner 02/01/2017, 9:21 PM

## 2017-02-01 NOTE — Anesthesia Postprocedure Evaluation (Signed)
Anesthesia Post Note  Patient: Wanda Moreno  Procedure(s) Performed: * No procedures listed *  Patient location during evaluation: Mother Baby Anesthesia Type: Epidural Level of consciousness: awake, awake and alert, oriented and patient cooperative Pain management: pain level controlled Vital Signs Assessment: post-procedure vital signs reviewed and stable Respiratory status: spontaneous breathing, nonlabored ventilation and respiratory function stable Cardiovascular status: stable Postop Assessment: no headache, no backache, no signs of nausea or vomiting and patient able to bend at knees Anesthetic complications: no        Last Vitals:  Vitals:   01/31/17 2232 02/01/17 0230  BP: 127/74 119/65  Pulse: 86 74  Resp: 18   Temp: 37 C 36.8 C    Last Pain:  Vitals:   02/01/17 0612  TempSrc:   PainSc: 5    Pain Goal:                 Boston Catarino L

## 2017-02-01 NOTE — Progress Notes (Signed)
Patient is eating, ambulating, voiding.  Pain control is good.  Appropriate lochia, no complaints.  Vitals:   01/31/17 2101 01/31/17 2134 01/31/17 2232 02/01/17 0230  BP: 134/72 128/72 127/74 119/65  Pulse: 80 90 86 74  Resp:  18 18   Temp:  98.6 F (37 C) 98.6 F (37 C) 98.3 F (36.8 C)  TempSrc:  Oral Oral Oral  SpO2:  99% 99% 98%  Weight:      Height:        Fundus firm Perineum without swelling. Ext: no CT  Lab Results  Component Value Date   WBC 9.6 01/31/2017   HGB 10.5 (L) 01/31/2017   HCT 31.1 (L) 01/31/2017   MCV 83.8 01/31/2017   PLT 230 01/31/2017    --/--/O POS (05/07 0911)  A/P Post partum day 1. Pt desires d/c tonight if peds ok's baby's d/c. Circ desired - consent obtained Doing well  Routine care.      Wanda Moreno

## 2017-02-01 NOTE — Lactation Note (Signed)
This note was copied from a baby's chart. Lactation Consultation Note  Patient Name: Wanda Moreno [id number] Date: 02/01/2017 Reason for consult: Follow-up assessment  Baby 63 hours old. Mom reports that her first child had difficulty latching, and had his lingual frenulum clipped. Mom states that she had issues with low breast milk supply as well. This baby's lingual frenulum is attached close to the tip of the baby's tongue. Enc mom to discuss with pediatrician. Assisted mom to latch baby to left breast in football position. Mom reported increased comfort with this position. Baby able to latch deeply and maintain a deep latch with lips flanged and rhythmic suckling, and there were a few swallows noted. Mom able to easily express colostrum from right breast. Enc mom to call for assistance with latching as needed, or if she has concerns.  Mom reports that she will be away from this baby sometime in the next month for the older child's heart surgery, but she doesn't have an exact date yet. Enc mom to focus on BF for now, and then start pumping after BF more established or if this baby starts getting tired at the breast or not wanting to latch. Discuss how a poor latch can impact breast milk supply.   Maternal Data    Feeding Feeding Type: Breast Fed  LATCH Score/Interventions Latch: Grasps breast easily, tongue down, lips flanged, rhythmical sucking.  Audible Swallowing: A few with stimulation  Type of Nipple: Everted at rest and after stimulation  Comfort (Breast/Nipple): Soft / non-tender     Hold (Positioning): Assistance needed to correctly position infant at breast and maintain latch. Intervention(s): Breastfeeding basics reviewed;Support Pillows;Position options;Skin to skin  LATCH Score: 8  Lactation Tools Discussed/Used     Consult Status Consult Status: Follow-up Date: 02/02/17 Follow-up type: In-patient    Andres Labrum 02/01/2017, 11:59 AM

## 2017-03-07 ENCOUNTER — Ambulatory Visit (HOSPITAL_COMMUNITY): Payer: Managed Care, Other (non HMO) | Admitting: Licensed Clinical Social Worker

## 2017-04-06 ENCOUNTER — Ambulatory Visit (HOSPITAL_COMMUNITY): Payer: Managed Care, Other (non HMO) | Admitting: Licensed Clinical Social Worker

## 2017-04-18 ENCOUNTER — Ambulatory Visit (HOSPITAL_COMMUNITY): Payer: Managed Care, Other (non HMO) | Admitting: Psychiatry

## 2017-05-04 ENCOUNTER — Ambulatory Visit (HOSPITAL_COMMUNITY): Payer: Managed Care, Other (non HMO) | Admitting: Licensed Clinical Social Worker

## 2017-05-19 ENCOUNTER — Encounter (HOSPITAL_COMMUNITY): Payer: Self-pay | Admitting: Psychiatry

## 2017-05-19 ENCOUNTER — Ambulatory Visit (INDEPENDENT_AMBULATORY_CARE_PROVIDER_SITE_OTHER): Payer: Managed Care, Other (non HMO) | Admitting: Psychiatry

## 2017-05-19 VITALS — BP 122/70 | HR 69 | Ht 64.5 in | Wt 169.4 lb

## 2017-05-19 DIAGNOSIS — Z79899 Other long term (current) drug therapy: Secondary | ICD-10-CM | POA: Diagnosis not present

## 2017-05-19 DIAGNOSIS — F331 Major depressive disorder, recurrent, moderate: Secondary | ICD-10-CM

## 2017-05-19 MED ORDER — FLUOXETINE HCL 20 MG PO CAPS: 20.0000 mg | ORAL_CAPSULE | Freq: Every day | ORAL | 1 refills | Status: AC
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
## 2017-09-26 ENCOUNTER — Encounter (HOSPITAL_COMMUNITY): Payer: Self-pay | Admitting: Psychiatry

## 2021-12-17 ENCOUNTER — Encounter

## 2022-02-12 DIAGNOSIS — Z1231 Encounter for screening mammogram for malignant neoplasm of breast: Secondary | ICD-10-CM

## 2022-04-07 ENCOUNTER — Encounter

## 2022-04-23 ENCOUNTER — Ambulatory Visit: Payer: PRIVATE HEALTH INSURANCE | Primary: Physician Assistant

## 2022-05-17 ENCOUNTER — Ambulatory Visit: Payer: PRIVATE HEALTH INSURANCE | Primary: Physician Assistant

## 2022-10-06 ENCOUNTER — Emergency Department: Admit: 2022-10-06 | Payer: PRIVATE HEALTH INSURANCE | Primary: Physician Assistant

## 2022-10-06 ENCOUNTER — Inpatient Hospital Stay
Admit: 2022-10-06 | Discharge: 2022-10-07 | Disposition: A | Discharge status: Home / Self Care | Payer: PRIVATE HEALTH INSURANCE

## 2022-10-06 DIAGNOSIS — R079 Chest pain, unspecified: Secondary | ICD-10-CM

## 2022-10-06 DIAGNOSIS — R0789 Other chest pain: Secondary | ICD-10-CM

## 2022-10-06 LAB — CBC WITH AUTO DIFFERENTIAL
Absolute Baso #: 0 10*3/uL (ref 0.0–0.2)
Absolute Eos #: 0.4 10*3/uL (ref 0.0–0.5)
Absolute Lymph #: 2.5 10*3/uL (ref 1.0–3.2)
Absolute Mono #: 0.4 10*3/uL (ref 0.3–1.0)
Basophils %: 0.6 % (ref 0.0–2.0)
Eosinophils %: 5.1 % (ref 0.0–7.0)
Hematocrit: 37.3 % (ref 34.0–47.0)
Hemoglobin: 12.3 g/dL (ref 11.5–15.7)
Immature Grans (Abs): 0.02 10*3/uL (ref 0.00–0.06)
Immature Granulocytes: 0.3 % (ref 0.0–0.6)
Lymphocytes: 35 % (ref 15.0–45.0)
MCH: 29.3 pg (ref 27.0–34.5)
MCHC: 33 g/dL (ref 32.0–36.0)
MCV: 88.8 fL (ref 81.0–99.0)
MPV: 9.7 fL (ref 7.2–13.2)
Monocytes: 5.4 % (ref 4.0–12.0)
Neutrophils %: 53.6 % (ref 42.0–74.0)
Neutrophils Absolute: 3.8 10*3/uL (ref 1.6–7.3)
Platelets: 283 10*3/uL (ref 140–440)
RBC: 4.2 x10e6/mcL (ref 3.60–5.20)
RDW: 11.7 % (ref 11.0–16.0)
WBC: 7.1 10*3/uL (ref 3.8–10.6)

## 2022-10-06 LAB — HEPATIC FUNCTION PANEL
ALT: 22 U/L (ref 0–35)
AST: 21 U/L (ref 0–35)
Albumin: 4.2 g/dL (ref 3.5–5.2)
Alk Phosphatase: 70 U/L (ref 35–117)
Bilirubin, Direct: <0.2 mg/dL (ref 0.00–0.30)
Total Bilirubin: 0.32 mg/dL (ref 0.00–1.20)
Total Protein: 7.1 g/dL (ref 6.4–8.3)

## 2022-10-06 LAB — BASIC METABOLIC PANEL
Anion Gap: 13 mmol/L (ref 2–17)
BUN: 10 mg/dL (ref 6–20)
CO2: 23 mmol/L (ref 22–29)
Calcium: 9.3 mg/dL (ref 8.6–10.0)
Chloride: 102 mmol/L (ref 98–107)
Creatinine: 0.8 mg/dL (ref 0.5–1.0)
Est, Glom Filt Rate: 93 mL/min/1.73m (ref >=60)
Glucose: 89 mg/dL (ref 70–99)
OSMOLALITY CALCULATED: 274 mOsm/kg (ref 270–287)
Potassium: 3.6 mmol/L (ref 3.5–5.3)
Sodium: 138 mmol/L (ref 135–145)

## 2022-10-06 LAB — TROPONIN
Troponin, High Sensitivity: <6 ng/L (ref 0–14)
Troponin, High Sensitivity: <6 ng/L (ref 0–14)

## 2022-10-06 LAB — D-DIMER, QUANTITATIVE: D-Dimer, Quant: 0.37 CD:295178345 (ref 0.19–0.50)

## 2022-10-06 NOTE — ED Provider Notes (Signed)
[facility] EMERGENCY DEPT  EMERGENCY DEPARTMENT ENCOUNTER      Pt Name: Jackie Dixon  MRN: [id number]  Birthdate 12-Jan-1976  Date/Time of evaluation: 10/06/2022  Provider evaluation time: 10/06/22 1637  Provider: Anderson Malta, MD    CHIEF COMPLAINT       Chief Complaint   Patient presents with    Chest Pain     Patient c/o 2 to 3 minute episode of chest pain- epigastric region and under left breast. States it started around 1430 today while standing up on the phone. States the pain moved to her neck and left hand became tingly. Denies cardiac hx.          HISTORY OF PRESENT ILLNESS      Jackie Dixon is a 47 y.o. female who presents to the emergency department with chest pain.     HPI  Patient is a 47 year old female presenting to the emergency department with an episode of left-sided epigastric and chest discomfort with some radiation to the left neck and left arm that started while she was cleaning the bathroom.  She did feel some shortness of breath at this time.      This episode resolved after 2 to 3 minutes.  She has been asymptomatic and pain-free since that time.  Patient denies any history of hypertension, hyperlipidemia or diabetes.  She is a non-smoker.  She is not on hormonal replacement or birth control.  No history of immobilization/long distance travel.  Her son has congenital heart disease but she denies any strong family history of heart disease at a young age.  Patient has not had any heart disease diagnosed for herself.    She reports that she is typically able to exercise/ambulate without any chest pain, shortness of breath or chest pressure.    She denies any trauma or injury to that area.  Patient denies any right upper quadrant pain or additional GI symptoms.  No rash reported.  She denies any nausea vomiting or diaphoresis.  No other concerns or complaints.    Nursing Notes were reviewed.    REVIEW OF SYSTEMS         Review of Systems   Constitutional:  Negative for chills and fever.   HENT:   Negative for congestion.    Respiratory:  Positive for shortness of breath. Negative for cough and chest tightness.    Cardiovascular:  Positive for chest pain. Negative for palpitations and leg swelling.   Gastrointestinal:  Negative for abdominal pain, diarrhea, nausea and vomiting.   Genitourinary:  Negative for dysuria and flank pain.   Musculoskeletal:  Positive for neck pain.   Skin:  Negative for rash.   Neurological:  Negative for weakness and headaches.   Psychiatric/Behavioral:  Negative for confusion.        Except as noted above the remainder of the review of systems was reviewed and negative.       PAST MEDICAL HISTORY     Past Medical History:   Diagnosis Date    Depression     Melanoma (HCC)          SURGICAL HISTORY     History reviewed. No pertinent surgical history.      CURRENT MEDICATIONS       Previous Medications    FLUOXETINE (PROZAC) 40 MG CAPSULE    Take 1 capsule by mouth daily       ALLERGIES     Adhesive tape    FAMILY HISTORY  History reviewed. No pertinent family history.       SOCIAL HISTORY       Social History     Socioeconomic History    Marital status: Married     Spouse name: None    Number of children: None    Years of education: None    Highest education level: None   Tobacco Use    Smoking status: Never    Smokeless tobacco: Never   Substance and Sexual Activity    Alcohol use: Never    Drug use: Never       SCREENINGS         Glasgow Coma Scale  Eye Opening: Spontaneous  Best Verbal Response: Oriented                     CIWA Assessment  BP: 130/77  Pulse: 70                 PHYSICAL EXAM         ED Triage Vitals [10/06/22 1648]   BP Temp Temp Source Pulse Respirations SpO2 Height Weight - Scale   132/81 98.4 F (36.9 C) Oral 67 19 98 % 1.651 m (5\' 5" ) 79.4 kg (175 lb)       Physical Exam  Vitals and nursing note reviewed.   Constitutional:       Appearance: She is not ill-appearing or toxic-appearing.   HENT:      Head: Atraumatic.   Cardiovascular:      Rate and Rhythm:  Normal rate and regular rhythm.   Pulmonary:      Effort: Pulmonary effort is normal. No respiratory distress.      Breath sounds: Normal breath sounds.   Abdominal:      General: Abdomen is flat. There is no distension.      Tenderness: There is no abdominal tenderness. There is no guarding or rebound.   Musculoskeletal:         General: No tenderness.   Skin:     General: Skin is warm and dry.      Findings: No rash.   Neurological:      General: No focal deficit present.      Mental Status: She is alert and oriented to person, place, and time.   Psychiatric:         Mood and Affect: Mood normal.         DIAGNOSTIC RESULTS     EKG: All EKG's are interpreted by the Emergency Department Physician who either signs or Co-signs this chart in the absence of a cardiologist.    Normal sinus rhythm.  Rate of 63.  No ST elevation or depression noted.  No T wave inversions or hyperacute T waves.  No additional ischemic changes noted.    RADIOLOGY:   Non-plain film images such as CT, Ultrasound and MRI are read by the radiologist. Plain radiographic images are visualized and preliminarily interpreted by the emergency physician with the below findings:      Interpretation per the Radiologist below, if available at the time of this note:    XR CHEST PORTABLE   Final Result   No evidence for acute cardiopulmonary process.      If you have questions regarding this report, please feel free to call me    directly using [employer].              LABS:  Labs Reviewed   BASIC METABOLIC PANEL  CBC WITH AUTO DIFFERENTIAL   TROPONIN   TROPONIN   D-DIMER, QUANTITATIVE   HEPATIC FUNCTION PANEL       All other labs were within normal range or not returned as of this dictation.    EMERGENCY DEPARTMENT COURSE and DIFFERENTIAL DIAGNOSIS/MDM:   Vitals:    Vitals:    10/06/22 1648 10/06/22 1715 10/06/22 1730 10/06/22 1800   BP: 132/81 133/78 129/83 130/77   Pulse: 67 65 63 70   Resp: 19 15 12 13    Temp: 98.4 F (36.9 C)      TempSrc: Oral       SpO2: 98% 100% 99% 100%   Weight: 79.4 kg (175 lb)      Height: 1.651 m (5\' 5" )            Medical Decision Making    Patient with history as above presented with chest pain. History obtained from patient.    Patient was nontoxic, stable. Exam as above.     EKG reviewed.   Labs reviewed.   Independently reviewed imaging.   Reviewed external records.     The patient presents with chest pain for brief period that completely resolved.. Given the results of the CXR, this is not likely caused by a pneumothorax. Given the pt's lack of infectious symptoms, no cough or fever, and the clear chest x-ray, this is likely not caused by pneumonia. The pt has no signs of aortic dissection on CXR and does not describe pain consistent with this disease process. The EKG obtained does not show any ST elevation or depression, therefore STEMI or pericarditis are low on the differential. The patient is low risk by HEART score 0 and has had negative troponins x2 and nonischemic EKG here. PE was considered, however given the pt is not tachycardic, hypoxic or tachypneic, no signs or symptoms of DVT on exam, and without significant risk factors for clotting diathesis I do not feel the pt warrants any further workup as this is not likely the cause of symptoms today.    Consideration was given for admission, but the patient was stable for outpatient management.    Disposition: Discussed need to follow up diagnostics, including incidental findings. Discharged with instructions to obtain outpatient follow up of patient's symptoms and findings, with strict return precautions if patient develops new or worsening symptoms.        Amount and/or Complexity of Data Reviewed  Labs: ordered.  Radiology: ordered.  ECG/medicine tests: ordered.           REASSESSMENT     ED Course as of 10/06/22 1913   Wed Oct 06, 2022   1911 Troponin x 2 less than 6.  No delta.  Patient has not had any chest pain or any associated symptoms throughout her stay in the  department.  She has remained hemodynamically stable and in no acute distress.  D-dimer negative.  Patient feels comfortable with discharge home.  Will place a referral for cardiology and have recommended primary care follow-up as well.  Return precautions given.  Stable for discharge. [DG]      ED Course User Index  [DG] Niel Hummer Joen Laura, MD             CONSULTS:  None    PROCEDURES:  Unless otherwise noted below, none     Procedures        FINAL IMPRESSION      1. Chest pain, unspecified type  DISPOSITION/PLAN   DISPOSITION Decision To Discharge 10/06/2022 07:02:35 PM      PATIENT REFERRED TO:  Caswell [place], [place]  [address] [place] [address]  [phone]      As needed, If symptoms worsen    [facility] EMERGENCY DEPT  [place] [id number]  [phone]    As needed    [facility] - [facility] [address]  [address], [address]  [place] [address]  [phone]  Schedule an appointment as soon as possible for a visit         DISCHARGE MEDICATIONS:  New Prescriptions    No medications on file       (Please note that portions of this note were completed with a voice recognition program.  Efforts were made to edit the dictations but occasionally words are mis-transcribed.)    Sherlyn Lees, MD (electronically signed)  Attending Emergency Physician            Sherlyn Lees, MD  10/06/22 [phone]

## 2022-10-07 LAB — EKG 12-LEAD
P Axis: 72 degrees
P-R Interval: 132 ms
Q-T Interval: 402 ms
QRS Duration: 90 ms
QTc Calculation (Bazett): 409 ms
R Axis: 78 degrees
T Axis: 53 degrees
Ventricular Rate: 63 {beats}/min

## 2022-11-18 NOTE — Telephone Encounter (Signed)
LVM FOR PT TO CB TO CONFIRM NP APPT.

## 2022-11-19 ENCOUNTER — Encounter: Payer: PRIVATE HEALTH INSURANCE | Attending: Cardiovascular Disease | Primary: Physician Assistant

## 2023-11-30 ENCOUNTER — Encounter

## 2024-01-18 ENCOUNTER — Inpatient Hospital Stay: Admit: 2024-01-18 | Payer: PRIVATE HEALTH INSURANCE | Primary: Physician Assistant

## 2024-01-18 DIAGNOSIS — Z1239 Encounter for other screening for malignant neoplasm of breast: Secondary | ICD-10-CM

## 2024-06-18 ENCOUNTER — Encounter

## 2024-07-25 ENCOUNTER — Inpatient Hospital Stay: Admit: 2024-07-25 | Payer: PRIVATE HEALTH INSURANCE | Primary: Physician Assistant

## 2024-07-25 DIAGNOSIS — G4452 New daily persistent headache (NDPH): Secondary | ICD-10-CM

## 2024-07-25 MED ORDER — GADOTERIDOL 279.3 MG/ML IV SOLN
279.3 | Freq: Once | INTRAVENOUS | Status: AC | PRN
Start: 2024-07-25 — End: 2024-07-25
  Administered 2024-07-25: 15:00:00 20 mL via INTRAVENOUS
